# Patient Record
Sex: Female | Born: 1975 | Race: White | Hispanic: No | Marital: Married | Smoking: Never smoker
Health system: Southern US, Community
[De-identification: ages and names within clinical notes are randomized; demographics above are authoritative.]

## PROBLEM LIST (undated history)

## (undated) DIAGNOSIS — F419 Anxiety disorder, unspecified: Secondary | ICD-10-CM

## (undated) HISTORY — PX: TONSILLECTOMY: SUR1361

## (undated) HISTORY — DX: Anxiety disorder, unspecified: F41.9

---

## 1998-11-26 ENCOUNTER — Ambulatory Visit (HOSPITAL_COMMUNITY): Admission: RE | Admit: 1998-11-26 | Discharge: 1998-11-26 | Payer: Self-pay | Admitting: Obstetrics and Gynecology

## 1999-12-21 ENCOUNTER — Other Ambulatory Visit: Admission: RE | Admit: 1999-12-21 | Discharge: 1999-12-21 | Payer: Self-pay | Admitting: Obstetrics and Gynecology

## 2000-06-21 ENCOUNTER — Inpatient Hospital Stay (HOSPITAL_COMMUNITY): Admission: AD | Admit: 2000-06-21 | Discharge: 2000-06-24 | Payer: Self-pay | Admitting: Obstetrics and Gynecology

## 2000-06-21 ENCOUNTER — Encounter (INDEPENDENT_AMBULATORY_CARE_PROVIDER_SITE_OTHER): Payer: Self-pay | Admitting: Specialist

## 2000-08-08 ENCOUNTER — Other Ambulatory Visit: Admission: RE | Admit: 2000-08-08 | Discharge: 2000-08-08 | Payer: Self-pay | Admitting: Obstetrics and Gynecology

## 2001-10-19 ENCOUNTER — Other Ambulatory Visit: Admission: RE | Admit: 2001-10-19 | Discharge: 2001-10-19 | Payer: Self-pay | Admitting: Obstetrics and Gynecology

## 2002-06-14 ENCOUNTER — Encounter: Payer: Self-pay | Admitting: Family Medicine

## 2002-06-14 ENCOUNTER — Ambulatory Visit (HOSPITAL_COMMUNITY): Admission: RE | Admit: 2002-06-14 | Discharge: 2002-06-14 | Payer: Self-pay | Admitting: Family Medicine

## 2002-12-02 ENCOUNTER — Other Ambulatory Visit: Admission: RE | Admit: 2002-12-02 | Discharge: 2002-12-02 | Payer: Self-pay | Admitting: Obstetrics and Gynecology

## 2003-12-31 ENCOUNTER — Other Ambulatory Visit: Admission: RE | Admit: 2003-12-31 | Discharge: 2003-12-31 | Payer: Self-pay | Admitting: Obstetrics and Gynecology

## 2005-01-27 ENCOUNTER — Emergency Department (HOSPITAL_COMMUNITY): Admission: EM | Admit: 2005-01-27 | Discharge: 2005-01-27 | Payer: Self-pay | Admitting: Family Medicine

## 2005-02-14 ENCOUNTER — Other Ambulatory Visit: Admission: RE | Admit: 2005-02-14 | Discharge: 2005-02-14 | Payer: Self-pay | Admitting: Obstetrics and Gynecology

## 2008-10-06 ENCOUNTER — Encounter: Admission: RE | Admit: 2008-10-06 | Discharge: 2008-10-06 | Payer: Self-pay | Admitting: Family Medicine

## 2009-10-27 IMAGING — US US SOFT TISSUE HEAD/NECK
1 series · 14 of 25 positions shown · non-contrast
Comparison: None

CLINICAL DATA: Enlarged thyroid on physical exam

THYROID ULTRASOUND
TECHNIQUE: Ultrasound examination of the thyroid gland and
adjacent soft tissues was performed.

[Series 1: us soft tissue head/neck · 0.08mm/px · 14 of 37 slices shown]
[im 1/37]
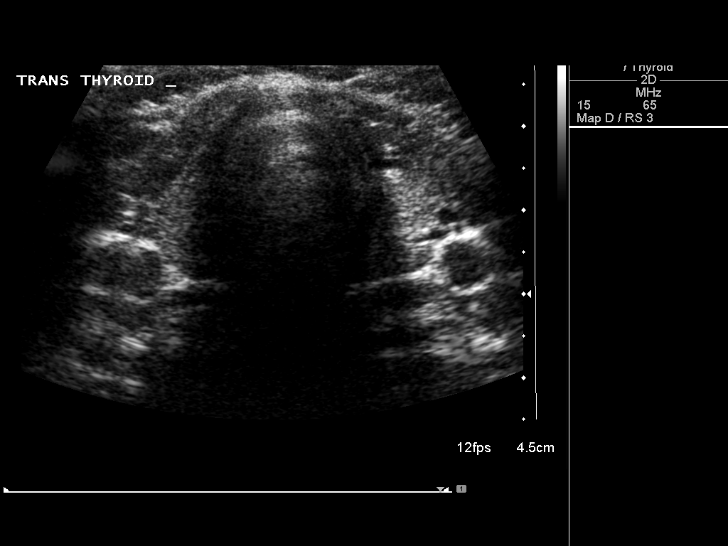
[im 4/37]
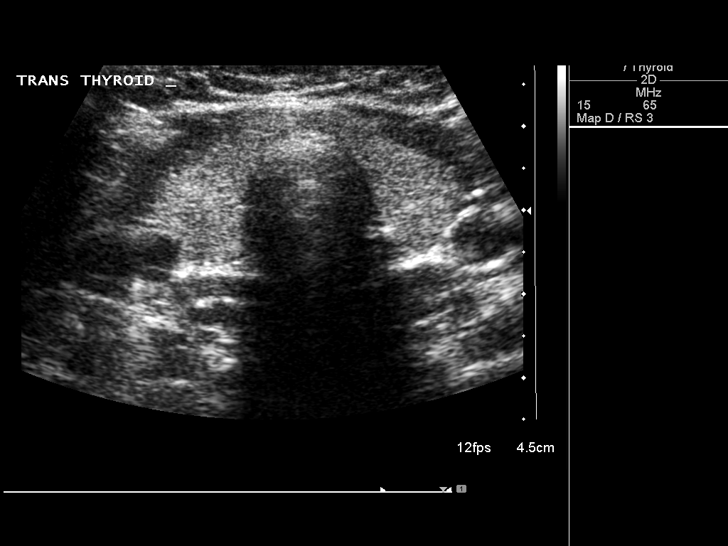
[im 7/37]
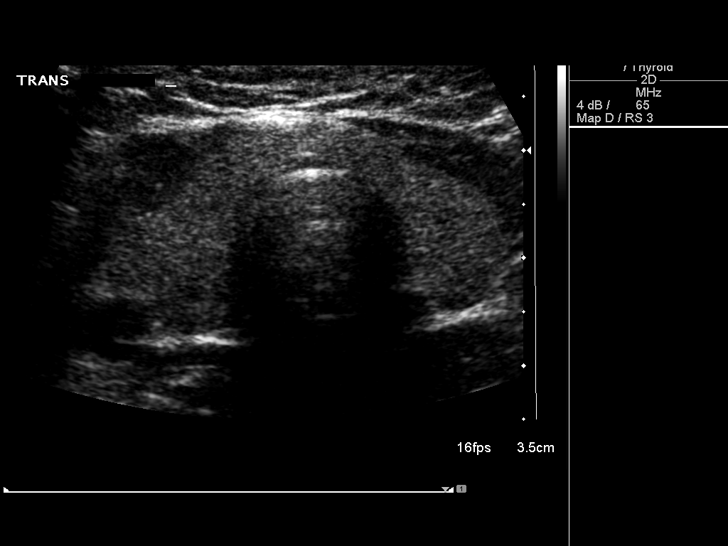
[im 10/37]
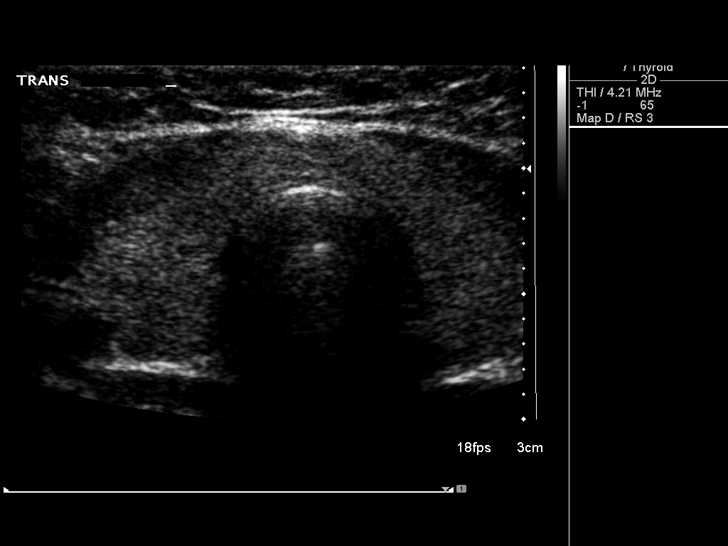
[im 13/37]
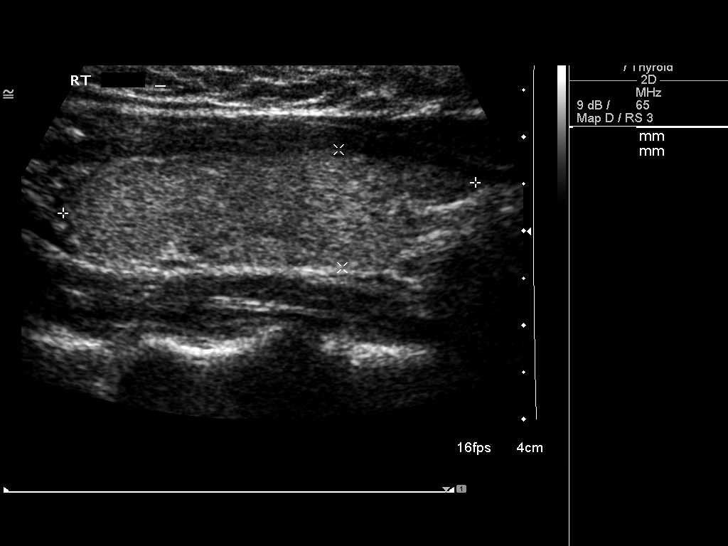
[im 14/37]
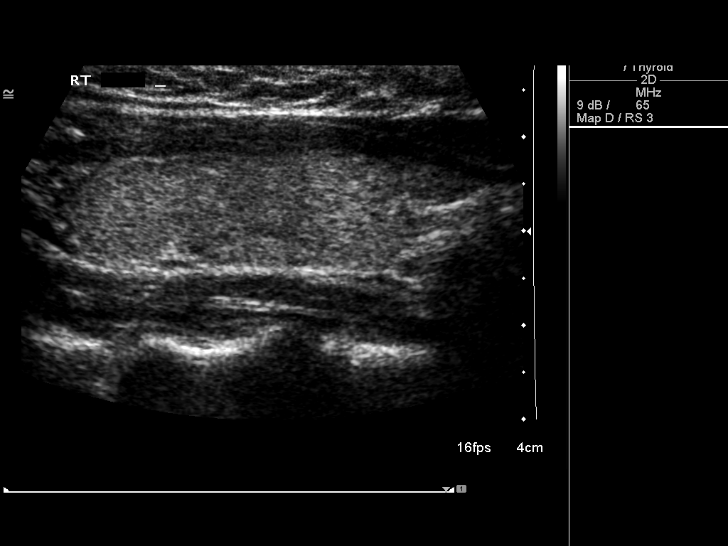
[im 17/37]
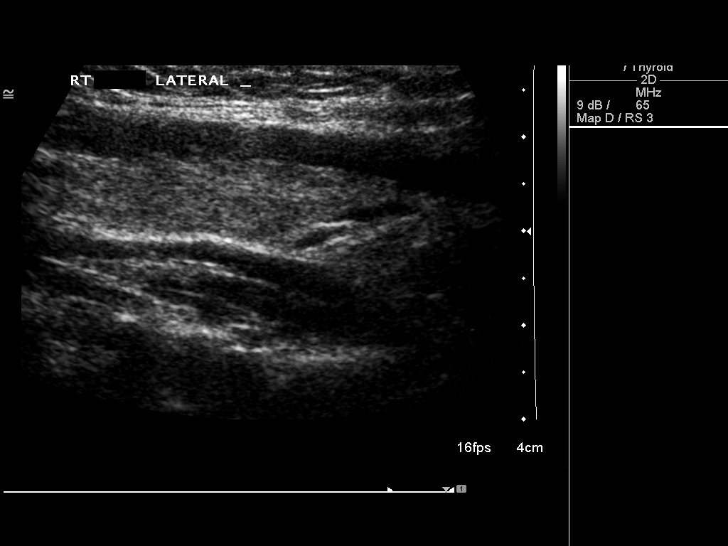
[im 20/37]
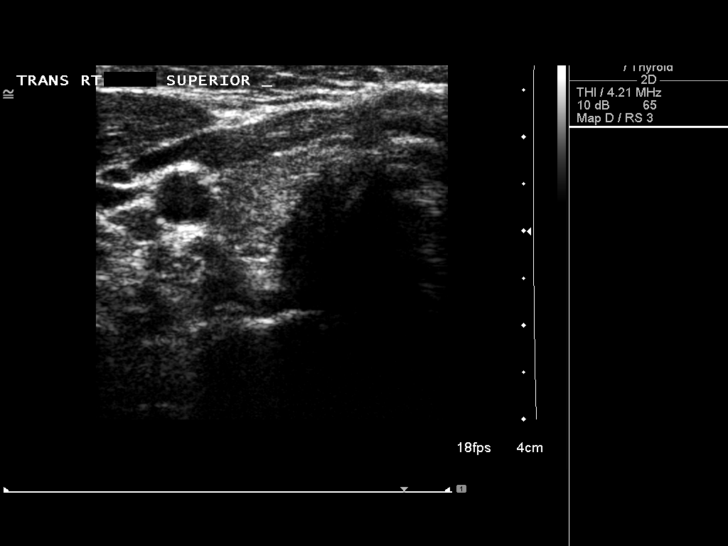
[im 23/37]
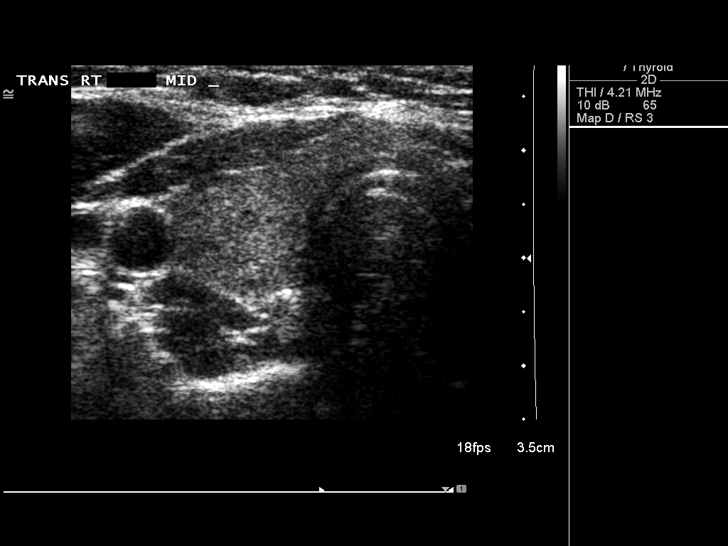
[im 25/37]
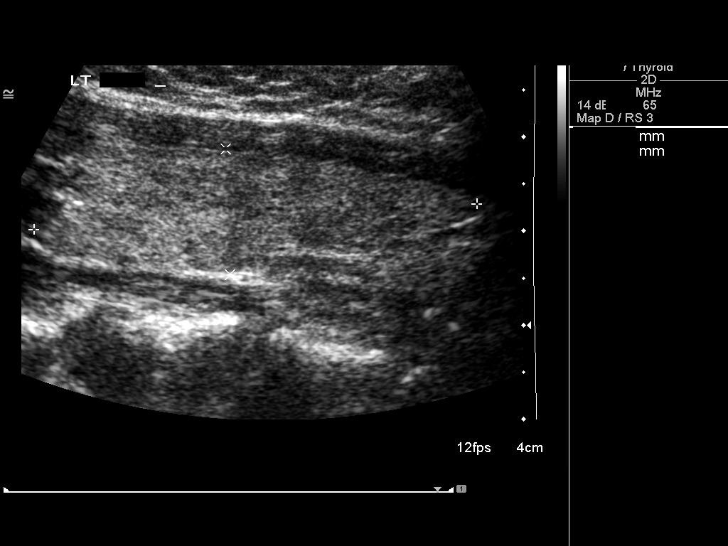
[im 28/37]
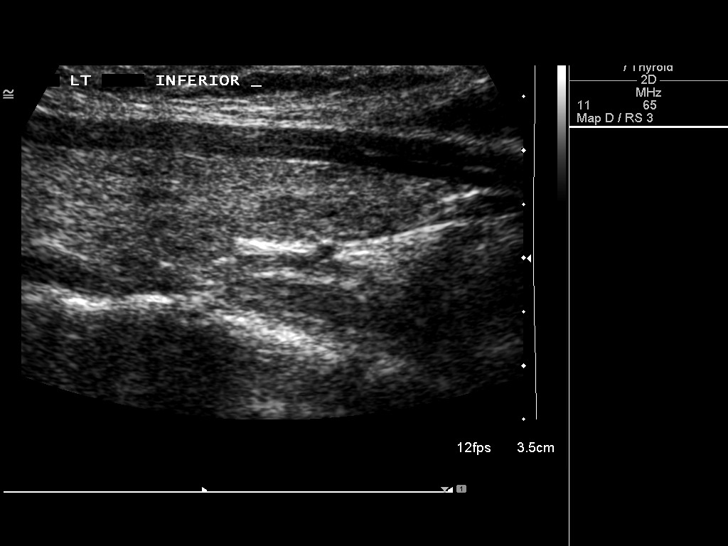
[im 31/37]
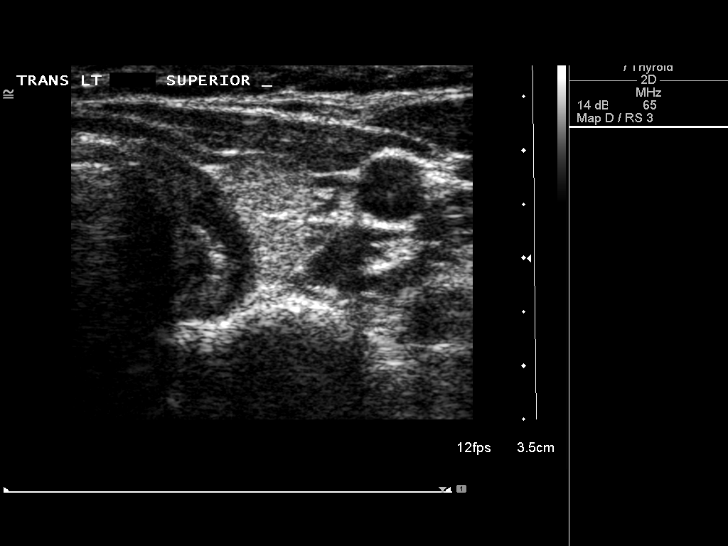
[im 34/37]
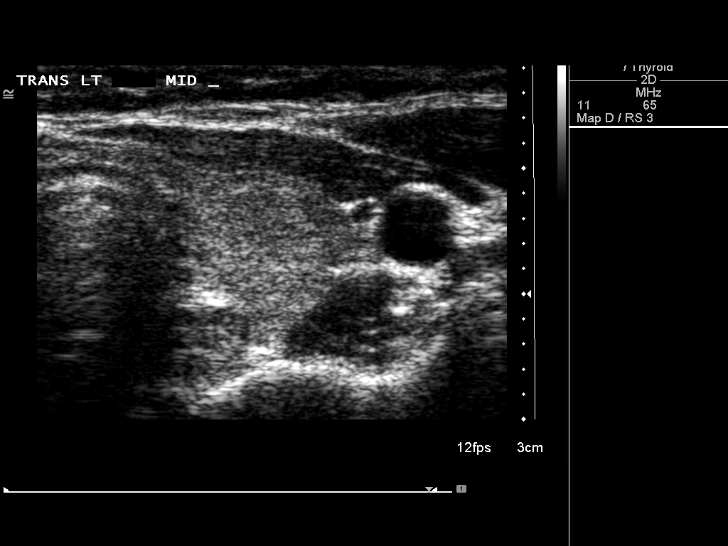
[im 37/37]
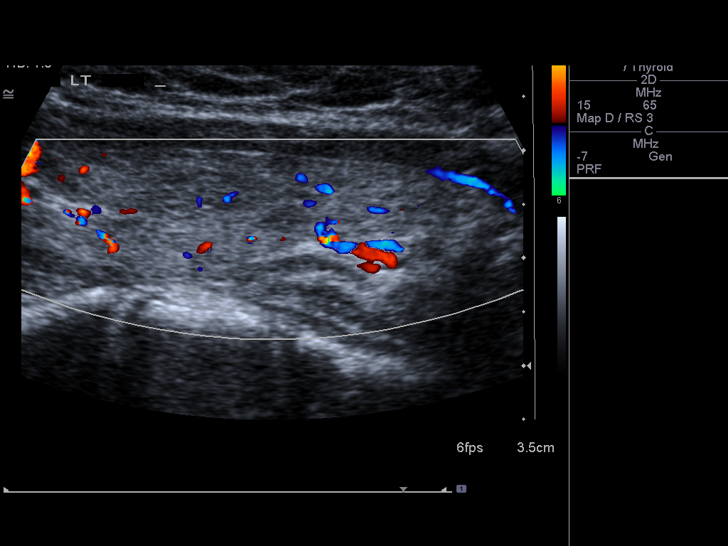

[14 of 25 positions shown; findings below may reference images not displayed]

FINDINGS: The thyroid gland is normal in size and in echogenicity.
The right lobe measures 4.4 cm sagittally, with a depth of 1.3 cm
and width of 1.3 cm.  The left lobe measures 4.7 x 1.3 x 1.4 cm,
with the isthmus measuring 3 mm.  No solid or cystic thyroid nodule
is seen.
IMPRESSION: The thyroid gland is normal in size with no solid or cystic nodule
noted.

## 2010-10-22 NOTE — Discharge Summary (Signed)
[facility]  Patient:    Danielle Wheeler, Danielle Wheeler                      MRN: [id number] Adm. Date:  [id number] Disc. Date: [id number] Attending:  Maxie Better                           Discharge Summary  ADMISSION DIAGNOSES:          Term gestation, third trimester vaginal bleeding, early labor.  DISCHARGE DIAGNOSES:          Term gestation-delivered, severe shoulder dystocia, brachial plexus injury, fourth degree perineal laceration, chorioamnionitis.  HISTORY OF PRESENT ILLNESS:   This is a 35 year old gravida 1, para 0 female at 39+ weeks gestation admitted with increased vaginal bleeding.  The patient has had her membranes stripped in the office earlier in the day.  Has done well and was subsequently brought in an hour later with a gush of vaginal bleeding.  On presentation she had a small amount of bleeding noted on her examination and the patient has had irregular contractions.  PRENATAL LABORATORIES:        Blood type O+.  Rubella immune.  Hepatitis B surface antigen is negative.  Patient had a normal anatomic fetal survey at 22 weeks and had an uncomplicated antenatal course.  HOSPITAL COURSE:              The patient was admitted.  She was placed on the monitor.  Reactive nonstress test was noted.  Irregular contractions were seen.  PHYSICAL EXAMINATION  VITAL SIGNS:                  Temperature 97.9.  PELVIC:                       Cervix was 3 cm, 100% effaced, +1 station.                                Artificial rupture of membranes was performed. Slight blood tinged fluid was noted.  The patient was group B strep culture positive and was given penicillin prophylaxis prior to rupturing the membranes.  She subsequently received an epidural and intrauterine catheter was placed.  Penicillin prophylaxis was continued as was Pitocin.  Patient subsequently became ______ and with pushing deep variable decelerations were noted on external  monitoring which resulted in an internal fetal scalp electrode being applied.  The patient had presumptive diagnosis of chorioamnionitis with a temperature spike as well as fetal tachycardia and gentamycin was added to her antibiotics regimen.  Tylenol was given for temperature.  The patient was asked to continue to push.  She was pushing for 1+ hours.  Fetal tachycardia had increased to the 190s and the patient had been given additional Tylenol to reduce her temperature which had remained persistently elevated.  Examination at that time revealed that the patient was fully dilated, +2 station, left occiput anterior presentation and the vertex could be seen with pushing with the labia separated.  Patient was offered vacuum assistance.  The risk of the procedure was explained to the patient and the husband.  They concurred with the plan.  The bladder was catheterized. The vacuum was applied with subsequent delivery after four contractions over a midline episiotomy with the second pull of a live female infant.  The episiotomy  extended to a fourth degree.  Pediatricians were present for the delivery second to vacuum as well as fetal tachycardia.  Weight of the baby was 8 pounds 12 ounces.  During the delivery severe shoulder dystocia was noted which was not responsive to suprapubic pressure and McRoberts maneuver.  The left posterior arm was delivered with subsequent delivery of right anterior shoulder.  Cord pH was obtained which was 7.22.  Apgars were 4 and 8.  The right extremity was noted to be moving.  There was no clavicle fracture on palpation.  Placenta came out spontaneously and was sent to pathology.  The fourth degree extension was also repaired ______ and there was a vaginal inspection performed.  Left vaginal sulcus laceration ______.  No cervical laceration was noted.  Intact mucosa was noted ______ and no sutures palpable.  The baby was then ______.  The patients postoperative  course was subsequently unremarkable.  She was continued on IV antibiotics for 24 hours and was subsequently afebrile throughout the course.  Her CBC on postoperative day #1 showed a hemoglobin of 11.1, hematocrit 32.4, white count 15.4.  By postpartum day #2 the patient remains afebrile with soreness in her perineal area, but otherwise had been passing flatus without incident.  She denied any fecal or flatus incontinence.  DISPOSITION:                  Home.  CONDITION ON DISCHARGE:       Stable.  FOLLOW-UP:                    In the office at [facility] at two weeks postpartum.  Discharge instructions were given with postpartum booklet in addition to no straining with bowel movements, stool softener one p.o. b.i.d., and ______. DD:  07/28/00 TD:  07/28/00 Job: [id number]

## 2012-09-24 ENCOUNTER — Encounter: Payer: Self-pay | Admitting: *Deleted

## 2012-09-24 ENCOUNTER — Other Ambulatory Visit: Payer: Self-pay | Admitting: *Deleted

## 2012-09-24 MED ORDER — VILAZODONE HCL 40 MG PO TABS: 20.0000 mg | ORAL_TABLET | Freq: Every day | ORAL | Status: DC

## 2012-09-24 NOTE — Telephone Encounter (Signed)
LAST OV 11/13. 

## 2012-10-05 ENCOUNTER — Ambulatory Visit (INDEPENDENT_AMBULATORY_CARE_PROVIDER_SITE_OTHER): Payer: BC Managed Care – PPO | Admitting: Nurse Practitioner

## 2012-10-05 ENCOUNTER — Encounter: Payer: Self-pay | Admitting: Nurse Practitioner

## 2012-10-05 ENCOUNTER — Telehealth: Payer: Self-pay | Admitting: Nurse Practitioner

## 2012-10-05 VITALS — BP 113/77 | HR 83 | Temp 97.0°F | Ht 61.0 in | Wt 188.0 lb

## 2012-10-05 DIAGNOSIS — J309 Allergic rhinitis, unspecified: Secondary | ICD-10-CM

## 2012-10-05 MED ORDER — CETIRIZINE HCL 10 MG PO TABS: 10.0000 mg | ORAL_TABLET | Freq: Every day | ORAL | Status: DC

## 2012-10-05 MED ORDER — FLUTICASONE PROPIONATE 50 MCG/ACT NA SUSP: 2.0000 | Freq: Every day | NASAL | Status: AC

## 2012-10-05 MED ORDER — CETIRIZINE HCL 10 MG PO TABS: 10.0000 mg | ORAL_TABLET | Freq: Every day | ORAL | Status: AC

## 2012-10-05 MED ORDER — FLUTICASONE PROPIONATE 50 MCG/ACT NA SUSP: 2.0000 | Freq: Every day | NASAL | Status: DC

## 2012-10-05 NOTE — Progress Notes (Signed)
  Subjective:    Patient ID: Danielle Wheeler, female    DOB: 06/09/1975, 37 y.o.   MRN: [id number]  HPI- Patient in today c/o congestion- more in her chest than sinuses. She was given antibiotic 3 weeks ago and got better then started back up after she finished the antibiotic. Cough is dry. She has tried mucinex maximum strength and claritin- no relief.    Review of Systems  Constitutional: Negative for fever and chills.  HENT: Negative for ear pain, congestion, rhinorrhea, sneezing, postnasal drip and sinus pressure.   Eyes: Negative.   Respiratory: Positive for cough and chest tightness.   Cardiovascular: Negative.        Objective:   Physical Exam  Constitutional: She appears well-developed and well-nourished.  HENT:  Right Ear: Hearing, tympanic membrane, external ear and ear canal normal.  Left Ear: Tympanic membrane, external ear and ear canal normal.  Nose: Mucosal edema and rhinorrhea present. Right sinus exhibits no maxillary sinus tenderness and no frontal sinus tenderness. Left sinus exhibits no maxillary sinus tenderness and no frontal sinus tenderness.  Mouth/Throat: Posterior oropharyngeal erythema present.  Eyes: EOM are normal. Pupils are equal, round, and reactive to light.  Neck: Normal range of motion. Neck supple.  Cardiovascular: Normal rate, normal heart sounds and intact distal pulses.   Pulmonary/Chest: Effort normal and breath sounds normal.  Lymphadenopathy:    She has no cervical adenopathy.  Skin: Skin is warm and dry.  Psychiatric: She has a normal mood and affect. Her behavior is normal. Judgment and thought content normal.   BP 113/77  Pulse 83  Temp(Src) 97 F (36.1 C) (Oral)  Ht 5\' 1"  (1.549 m)  Wt 188 lb (85.276 kg)  BMI 35.54 kg/m2        Assessment & Plan:  1. Allergic rhinitis Avoid allergens Force fluids  - fluticasone (FLONASE) 50 MCG/ACT nasal spray; Place 2 sprays into the nose daily.  Dispense: 16 g; Refill: 6 - cetirizine  (ZYRTEC) 10 MG tablet; Take 1 tablet (10 mg total) by mouth daily.  Dispense: 30 tablet; Refill: 11  Mary-Margaret Daphine Deutscher, FNP

## 2012-10-05 NOTE — Patient Instructions (Signed)
Allergic Rhinitis  Allergic rhinitis is when the mucous membranes in the nose respond to allergens. Allergens are particles in the air that cause your body to have an allergic reaction. This causes you to release allergic antibodies. Through a chain of events, these eventually cause you to release histamine into the blood stream (hence the use of antihistamines). Although meant to be protective to the body, it is this release that causes your discomfort, such as frequent sneezing, congestion and an itchy runny nose.    CAUSES    The pollen allergens may come from grasses, trees, and weeds. This is seasonal allergic rhinitis, or "hay fever." Other allergens cause year-round allergic rhinitis (perennial allergic rhinitis) such as house dust mite allergen, pet dander and mold spores.    SYMPTOMS     Nasal stuffiness (congestion).   Runny, itchy nose with sneezing and tearing of the eyes.   There is often an itching of the mouth, eyes and ears.  It cannot be cured, but it can be controlled with medications.  DIAGNOSIS    If you are unable to determine the offending allergen, skin or blood testing may find it.  TREATMENT     Avoid the allergen.   Medications and allergy shots (immunotherapy) can help.   Hay fever may often be treated with antihistamines in pill or nasal spray forms. Antihistamines block the effects of histamine. There are over-the-counter medicines that may help with nasal congestion and swelling around the eyes. Check with your caregiver before taking or giving this medicine.  If the treatment above does not work, there are many new medications your caregiver can prescribe. Stronger medications may be used if initial measures are ineffective. Desensitizing injections can be used if medications and avoidance fails. Desensitization is when a patient is given ongoing shots until the body becomes less sensitive to the allergen. Make sure you follow up with your caregiver if problems continue.   SEEK MEDICAL CARE IF:     You develop fever (more than 100.5 F (38.1 C).   You develop a cough that does not stop easily (persistent).   You have shortness of breath.   You start wheezing.   Symptoms interfere with normal daily activities.  Document Released: 02/15/2001 Document Revised: 08/15/2011 Document Reviewed: 08/27/2008  ExitCare Patient Information 2013 [employer].

## 2012-10-05 NOTE — Telephone Encounter (Signed)
appt made

## 2012-10-05 NOTE — Addendum Note (Signed)
Addended by: Bennie Pierini on: 10/05/2012 03:45 PM   Modules accepted: Orders

## 2013-04-17 ENCOUNTER — Other Ambulatory Visit: Payer: Self-pay | Admitting: Nurse Practitioner

## 2013-04-17 NOTE — Telephone Encounter (Signed)
NOT SEEN IN 6 MONTHS AND ASKING FOR 90 DAY SUPPLY

## 2013-05-15 ENCOUNTER — Other Ambulatory Visit: Payer: Self-pay | Admitting: Nurse Practitioner

## 2013-10-31 ENCOUNTER — Other Ambulatory Visit: Payer: Self-pay

## 2013-10-31 MED ORDER — VILAZODONE HCL 40 MG PO TABS: ORAL_TABLET | ORAL | Status: DC

## 2013-10-31 NOTE — Telephone Encounter (Signed)
Last seen 10/05/12  MMM

## 2014-04-09 ENCOUNTER — Encounter: Payer: Self-pay | Admitting: Nurse Practitioner

## 2014-04-09 DIAGNOSIS — F410 Panic disorder [episodic paroxysmal anxiety] without agoraphobia: Secondary | ICD-10-CM | POA: Insufficient documentation

## 2014-04-09 DIAGNOSIS — F411 Generalized anxiety disorder: Secondary | ICD-10-CM | POA: Insufficient documentation

## 2014-05-06 ENCOUNTER — Telehealth: Payer: Self-pay | Admitting: Nurse Practitioner

## 2014-05-06 ENCOUNTER — Other Ambulatory Visit: Payer: Self-pay | Admitting: Nurse Practitioner

## 2014-05-06 MED ORDER — VILAZODONE HCL 40 MG PO TABS: ORAL_TABLET | ORAL | Status: DC

## 2014-05-06 NOTE — Telephone Encounter (Signed)
Appointment given for 12/29 with Paulene FloorMary Martin, FNP

## 2014-06-03 ENCOUNTER — Encounter: Payer: Self-pay | Admitting: Nurse Practitioner

## 2014-06-03 ENCOUNTER — Ambulatory Visit (INDEPENDENT_AMBULATORY_CARE_PROVIDER_SITE_OTHER): Payer: BC Managed Care – PPO | Admitting: Nurse Practitioner

## 2014-06-03 VITALS — BP 119/75 | HR 73 | Temp 96.7°F | Ht 61.0 in | Wt 178.0 lb

## 2014-06-03 DIAGNOSIS — F410 Panic disorder [episodic paroxysmal anxiety] without agoraphobia: Secondary | ICD-10-CM

## 2014-06-03 DIAGNOSIS — F411 Generalized anxiety disorder: Secondary | ICD-10-CM

## 2014-06-03 MED ORDER — VILAZODONE HCL 40 MG PO TABS: ORAL_TABLET | ORAL | Status: DC

## 2014-06-03 NOTE — Patient Instructions (Signed)
Stress and Stress Management Stress is a normal reaction to life events. It is what you feel when life demands more than you are used to or more than you can handle. Some stress can be useful. For example, the stress reaction can help you catch the last bus of the day, study for a test, or meet a deadline at work. But stress that occurs too often or for too long can cause problems. It can affect your emotional health and interfere with relationships and normal daily activities. Too much stress can weaken your immune system and increase your risk for physical illness. If you already have a medical problem, stress can make it worse. CAUSES  All sorts of life events may cause stress. An event that causes stress for one person may not be stressful for another person. Major life events commonly cause stress. These may be positive or negative. Examples include losing your job, moving into a new home, getting married, having a baby, or losing a loved one. Less obvious life events may also cause stress, especially if they occur day after day or in combination. Examples include working long hours, driving in traffic, caring for children, being in debt, or being in a difficult relationship. SIGNS AND SYMPTOMS Stress may cause emotional symptoms including, the following:  Anxiety. This is feeling worried, afraid, on edge, overwhelmed, or out of control.  Anger. This is feeling irritated or impatient.  Depression. This is feeling sad, down, helpless, or guilty.  Difficulty focusing, remembering, or making decisions. Stress may cause physical symptoms, including the following:   Aches and pains. These may affect your head, neck, back, stomach, or other areas of your body.  Tight muscles or clenched jaw.  Low energy or trouble sleeping. Stress may cause unhealthy behaviors, including the following:   Eating to feel better (overeating) or skipping meals.  Sleeping too little, too much, or both.  Working  too much or putting off tasks (procrastination).  Smoking, drinking alcohol, or using drugs to feel better. DIAGNOSIS  Stress is diagnosed through an assessment by your health care provider. Your health care provider will ask questions about your symptoms and any stressful life events.Your health care provider will also ask about your medical history and may order blood tests or other tests. Certain medical conditions and medicine can cause physical symptoms similar to stress. Mental illness can cause emotional symptoms and unhealthy behaviors similar to stress. Your health care provider may refer you to a mental health professional for further evaluation.  TREATMENT  Stress management is the recommended treatment for stress.The goals of stress management are reducing stressful life events and coping with stress in healthy ways.  Techniques for reducing stressful life events include the following:  Stress identification. Self-monitor for stress and identify what causes stress for you. These skills may help you to avoid some stressful events.  Time management. Set your priorities, keep a calendar of events, and learn to say "no." These tools can help you avoid making too many commitments. Techniques for coping with stress include the following:  Rethinking the problem. Try to think realistically about stressful events rather than ignoring them or overreacting. Try to find the positives in a stressful situation rather than focusing on the negatives.  Exercise. Physical exercise can release both physical and emotional tension. The key is to find a form of exercise you enjoy and do it regularly.  Relaxation techniques. These relax the body and mind. Examples include yoga, meditation, tai chi, biofeedback, deep  breathing, progressive muscle relaxation, listening to music, being out in nature, journaling, and other hobbies. Again, the key is to find one or more that you enjoy and can do  regularly.  Healthy lifestyle. Eat a balanced diet, get plenty of sleep, and do not smoke. Avoid using alcohol or drugs to relax.  Strong support network. Spend time with family, friends, or other people you enjoy being around.Express your feelings and talk things over with someone you trust. Counseling or talktherapy with a mental health professional may be helpful if you are having difficulty managing stress on your own. Medicine is typically not recommended for the treatment of stress.Talk to your health care provider if you think you need medicine for symptoms of stress. HOME CARE INSTRUCTIONS  Keep all follow-up visits as directed by your health care provider.  Take all medicines as directed by your health care provider. SEEK MEDICAL CARE IF:  Your symptoms get worse or you start having new symptoms.  You feel overwhelmed by your problems and can no longer manage them on your own. SEEK IMMEDIATE MEDICAL CARE IF:  You feel like hurting yourself or someone else. Document Released: 11/16/2000 Document Revised: 10/07/2013 Document Reviewed: 01/15/2013 ExitCare Patient Information 2015 [employer]. This information is not intended to replace advice given to you by your health care provider. Make sure you discuss any questions you have with your health care provider.  

## 2014-06-03 NOTE — Progress Notes (Signed)
   Subjective:    Patient ID: Danielle McmurrayWindy W Waldron, female    DOB: April 16, 1976, 38 y.o.   MRN: [id number]  HPI Patient here today for follow up- - GAD and panic attacks- She is currently on Viibryd 40mg  daily. Patient says taht it is working great. No side effects.    Review of Systems  Constitutional: Negative.   HENT: Negative.   Respiratory: Negative.   Cardiovascular: Negative.   Genitourinary: Negative.   Neurological: Negative.   Psychiatric/Behavioral: Negative.   All other systems reviewed and are negative.      Objective:   Physical Exam  Constitutional: She is oriented to person, place, and time. She appears well-developed and well-nourished.  Cardiovascular: Normal rate and normal heart sounds.   Pulmonary/Chest: Effort normal.  Abdominal: Soft. Bowel sounds are normal.  Neurological: She is alert and oriented to person, place, and time.  Skin: Skin is warm.  Psychiatric: She has a normal mood and affect. Her behavior is normal. Judgment and thought content normal.   BP 119/75 mmHg  Pulse 73  Temp(Src) 96.7 F (35.9 C) (Oral)  Ht 5\' 1"  (1.549 m)  Wt 178 lb (80.74 kg)  BMI 33.65 kg/m2        Assessment & Plan:   1. GAD (generalized anxiety disorder)   2. Panic attacks    Meds ordered this encounter  Medications  . Vilazodone HCl (VIIBRYD) 40 MG TABS    Sig: TAKE 0.5 TABLETS (20 MG TOTAL) BY MOUTH DAILY.    Dispense:  30 tablet    Refill:  5    Order Specific Question:  Supervising Provider    Answer:  Deborra MedinaMOORE, DONALD W [[id number]]   Stress management RTO prn  Mary-Margaret Daphine DeutscherMartin, FNP

## 2014-09-15 ENCOUNTER — Other Ambulatory Visit: Payer: Self-pay | Admitting: *Deleted

## 2014-09-15 MED ORDER — VILAZODONE HCL 40 MG PO TABS: ORAL_TABLET | ORAL | Status: DC

## 2015-12-02 ENCOUNTER — Other Ambulatory Visit: Payer: Self-pay | Admitting: Nurse Practitioner

## 2015-12-14 ENCOUNTER — Telehealth: Payer: Self-pay | Admitting: Nurse Practitioner

## 2015-12-14 MED ORDER — VILAZODONE HCL 40 MG PO TABS: ORAL_TABLET | ORAL | Status: DC

## 2015-12-14 NOTE — Telephone Encounter (Signed)
viibryd refilled

## 2015-12-14 NOTE — Telephone Encounter (Signed)
Please review and advise.

## 2016-01-04 ENCOUNTER — Ambulatory Visit: Payer: Self-pay | Admitting: Nurse Practitioner

## 2016-01-05 ENCOUNTER — Encounter: Payer: Self-pay | Admitting: Nurse Practitioner

## 2016-01-25 ENCOUNTER — Telehealth: Payer: Self-pay | Admitting: Nurse Practitioner

## 2016-01-25 NOTE — Telephone Encounter (Signed)
Pt given appt with MMM 02/19/16 at 8:15.

## 2016-01-28 ENCOUNTER — Ambulatory Visit: Payer: Self-pay | Admitting: Nurse Practitioner

## 2016-02-19 ENCOUNTER — Ambulatory Visit (INDEPENDENT_AMBULATORY_CARE_PROVIDER_SITE_OTHER): Payer: BLUE CROSS/BLUE SHIELD | Admitting: Nurse Practitioner

## 2016-02-19 ENCOUNTER — Encounter: Payer: Self-pay | Admitting: Nurse Practitioner

## 2016-02-19 VITALS — BP 131/84 | HR 65 | Temp 97.3°F | Ht 61.0 in | Wt 172.0 lb

## 2016-02-19 DIAGNOSIS — F410 Panic disorder [episodic paroxysmal anxiety] without agoraphobia: Secondary | ICD-10-CM

## 2016-02-19 DIAGNOSIS — F411 Generalized anxiety disorder: Secondary | ICD-10-CM

## 2016-02-19 NOTE — Progress Notes (Signed)
   Subjective:    Patient ID: Danielle Wheeler, female    DOB: 04-21-1976, 40 y.o.   MRN: [id number]  HPI  Patient here today for follow up- - GAD and panic attacks- She is currently on Viibryd 20mg  daily. Patient says that it is working great. No side effects.    Review of Systems  Constitutional: Negative.   HENT: Negative.   Respiratory: Negative.   Cardiovascular: Negative.   Genitourinary: Negative.   Neurological: Negative.   Psychiatric/Behavioral: Negative.   All other systems reviewed and are negative.      Objective:   Physical Exam  Constitutional: She is oriented to person, place, and time. She appears well-developed and well-nourished.  Cardiovascular: Normal rate and normal heart sounds.   Pulmonary/Chest: Effort normal.  Abdominal: Soft. Bowel sounds are normal.  Neurological: She is alert and oriented to person, place, and time.  Skin: Skin is warm.  Psychiatric: She has a normal mood and affect. Her behavior is normal. Judgment and thought content normal.   BP 131/84   Pulse 65   Temp 97.3 F (36.3 C) (Oral)   Ht 5\' 1"  (1.549 m)   Wt 172 lb (78 kg)   BMI 32.50 kg/m          Assessment & Plan:   1. GAD (generalized anxiety disorder)   2. Panic attacks    Continue viibryd as rx Stress management RTO in 6 months Patient will have pap and physical with GYN iin December  Bennie PieriniMary-Margaret Isadore Palecek, FNP

## 2016-02-19 NOTE — Patient Instructions (Signed)
Stress and Stress Management Stress is a normal reaction to life events. It is what you feel when life demands more than you are used to or more than you can handle. Some stress can be useful. For example, the stress reaction can help you catch the last bus of the day, study for a test, or meet a deadline at work. But stress that occurs too often or for too long can cause problems. It can affect your emotional health and interfere with relationships and normal daily activities. Too much stress can weaken your immune system and increase your risk for physical illness. If you already have a medical problem, stress can make it worse. CAUSES  All sorts of life events may cause stress. An event that causes stress for one person may not be stressful for another person. Major life events commonly cause stress. These may be positive or negative. Examples include losing your job, moving into a new home, getting married, having a baby, or losing a loved one. Less obvious life events may also cause stress, especially if they occur day after day or in combination. Examples include working long hours, driving in traffic, caring for children, being in debt, or being in a difficult relationship. SIGNS AND SYMPTOMS Stress may cause emotional symptoms including, the following:  Anxiety. This is feeling worried, afraid, on edge, overwhelmed, or out of control.  Anger. This is feeling irritated or impatient.  Depression. This is feeling sad, down, helpless, or guilty.  Difficulty focusing, remembering, or making decisions. Stress may cause physical symptoms, including the following:   Aches and pains. These may affect your head, neck, back, stomach, or other areas of your body.  Tight muscles or clenched jaw.  Low energy or trouble sleeping. Stress may cause unhealthy behaviors, including the following:   Eating to feel better (overeating) or skipping meals.  Sleeping too little, too much, or both.  Working  too much or putting off tasks (procrastination).  Smoking, drinking alcohol, or using drugs to feel better. DIAGNOSIS  Stress is diagnosed through an assessment by your health care provider. Your health care provider will ask questions about your symptoms and any stressful life events.Your health care provider will also ask about your medical history and may order blood tests or other tests. Certain medical conditions and medicine can cause physical symptoms similar to stress. Mental illness can cause emotional symptoms and unhealthy behaviors similar to stress. Your health care provider may refer you to a mental health professional for further evaluation.  TREATMENT  Stress management is the recommended treatment for stress.The goals of stress management are reducing stressful life events and coping with stress in healthy ways.  Techniques for reducing stressful life events include the following:  Stress identification. Self-monitor for stress and identify what causes stress for you. These skills may help you to avoid some stressful events.  Time management. Set your priorities, keep a calendar of events, and learn to say "no." These tools can help you avoid making too many commitments. Techniques for coping with stress include the following:  Rethinking the problem. Try to think realistically about stressful events rather than ignoring them or overreacting. Try to find the positives in a stressful situation rather than focusing on the negatives.  Exercise. Physical exercise can release both physical and emotional tension. The key is to find a form of exercise you enjoy and do it regularly.  Relaxation techniques. These relax the body and mind. Examples include yoga, meditation, tai chi, biofeedback, deep  breathing, progressive muscle relaxation, listening to music, being out in nature, journaling, and other hobbies. Again, the key is to find one or more that you enjoy and can do  regularly.  Healthy lifestyle. Eat a balanced diet, get plenty of sleep, and do not smoke. Avoid using alcohol or drugs to relax.  Strong support network. Spend time with family, friends, or other people you enjoy being around.Express your feelings and talk things over with someone you trust. Counseling or talktherapy with a mental health professional may be helpful if you are having difficulty managing stress on your own. Medicine is typically not recommended for the treatment of stress.Talk to your health care provider if you think you need medicine for symptoms of stress. HOME CARE INSTRUCTIONS  Keep all follow-up visits as directed by your health care provider.  Take all medicines as directed by your health care provider. SEEK MEDICAL CARE IF:  Your symptoms get worse or you start having new symptoms.  You feel overwhelmed by your problems and can no longer manage them on your own. SEEK IMMEDIATE MEDICAL CARE IF:  You feel like hurting yourself or someone else.   This information is not intended to replace advice given to you by your health care provider. Make sure you discuss any questions you have with your health care provider.   Document Released: 11/16/2000 Document Revised: 06/13/2014 Document Reviewed: 01/15/2013 Elsevier Interactive Patient Education 2016 Elsevier Inc.  

## 2016-05-11 DIAGNOSIS — Z6832 Body mass index (BMI) 32.0-32.9, adult: Secondary | ICD-10-CM | POA: Diagnosis not present

## 2016-05-11 DIAGNOSIS — Z1231 Encounter for screening mammogram for malignant neoplasm of breast: Secondary | ICD-10-CM | POA: Diagnosis not present

## 2016-05-11 DIAGNOSIS — Z01419 Encounter for gynecological examination (general) (routine) without abnormal findings: Secondary | ICD-10-CM | POA: Diagnosis not present

## 2017-01-10 ENCOUNTER — Other Ambulatory Visit: Payer: Self-pay | Admitting: Nurse Practitioner

## 2017-03-01 DIAGNOSIS — L218 Other seborrheic dermatitis: Secondary | ICD-10-CM | POA: Diagnosis not present

## 2017-06-19 DIAGNOSIS — Z6832 Body mass index (BMI) 32.0-32.9, adult: Secondary | ICD-10-CM | POA: Diagnosis not present

## 2017-06-19 DIAGNOSIS — Z01419 Encounter for gynecological examination (general) (routine) without abnormal findings: Secondary | ICD-10-CM | POA: Diagnosis not present

## 2017-06-19 DIAGNOSIS — Z1231 Encounter for screening mammogram for malignant neoplasm of breast: Secondary | ICD-10-CM | POA: Diagnosis not present

## 2017-07-07 DIAGNOSIS — Z30433 Encounter for removal and reinsertion of intrauterine contraceptive device: Secondary | ICD-10-CM | POA: Diagnosis not present

## 2017-08-14 DIAGNOSIS — N939 Abnormal uterine and vaginal bleeding, unspecified: Secondary | ICD-10-CM | POA: Diagnosis not present

## 2018-01-12 ENCOUNTER — Other Ambulatory Visit: Payer: Self-pay | Admitting: Nurse Practitioner

## 2018-01-12 NOTE — Telephone Encounter (Signed)
Patient aware she will need to be seen for refills on viibryd

## 2018-01-12 NOTE — Telephone Encounter (Signed)
Has not been seen in 2 years  Can't fill  Needs an appt.

## 2018-01-12 NOTE — Telephone Encounter (Signed)
[email or web address]

## 2018-01-30 ENCOUNTER — Encounter: Payer: Self-pay | Admitting: Nurse Practitioner

## 2018-01-30 ENCOUNTER — Ambulatory Visit: Payer: BLUE CROSS/BLUE SHIELD | Admitting: Nurse Practitioner

## 2018-01-30 VITALS — BP 126/86 | HR 66 | Temp 97.3°F | Ht 61.0 in | Wt 170.0 lb

## 2018-01-30 DIAGNOSIS — F411 Generalized anxiety disorder: Secondary | ICD-10-CM | POA: Diagnosis not present

## 2018-01-30 DIAGNOSIS — F410 Panic disorder [episodic paroxysmal anxiety] without agoraphobia: Secondary | ICD-10-CM | POA: Diagnosis not present

## 2018-01-30 MED ORDER — VILAZODONE HCL 40 MG PO TABS: ORAL_TABLET | ORAL | 1 refills | Status: DC

## 2018-01-30 NOTE — Progress Notes (Signed)
   Subjective:    Patient ID: Danielle Wheeler, female    DOB: 02/07/1976, 42 y.o.   MRN: [id number]   Chief Complaint: Medical Management of Chronic Issues   HPI:  1. Panic attacks  patient is on viibryd. Says she is doing well. No side effects  2. GAD (generalized anxiety disorder)  viibyrd helps with anxiety    Outpatient Encounter Medications as of 01/30/2018  Medication Sig  . VIIBRYD 40 MG TABS TAKE 1/2 TABLETS (20 MG TOTAL) BY MOUTH DAILY.  . cetirizine (ZYRTEC) 10 MG tablet Take 1 tablet (10 mg total) by mouth daily. (Patient not taking: Reported on 02/19/2016)  . fluticasone (FLONASE) 50 MCG/ACT nasal spray Place 2 sprays into the nose daily. (Patient not taking: Reported on 02/19/2016)       New complaints: None today  Social history: [place] at [employer] [place]   Review of Systems  Constitutional: Negative.   Respiratory: Negative.   Cardiovascular: Negative.   Neurological: Negative.   Psychiatric/Behavioral: Negative.   All other systems reviewed and are negative.      Objective:   Physical Exam  Constitutional: She is oriented to person, place, and time. She appears well-developed and well-nourished.  Cardiovascular: Normal rate.  Pulmonary/Chest: Effort normal.  Neurological: She is alert and oriented to person, place, and time.  Skin: Skin is warm.  Psychiatric: She has a normal mood and affect. Her behavior is normal. Judgment and thought content normal.    BP 126/86   Pulse 66   Temp (!) 97.3 F (36.3 C) (Oral)   Ht 5\' 1"  (1.549 m)   Wt 170 lb (77.1 kg)   BMI 32.12 kg/m        Assessment & Plan:  Danielle Wheeler in today with chief complaint of Medical Management of Chronic Issues   1. Panic attacks - Vilazodone HCl (VIIBRYD) 40 MG TABS; TAKE 1/2 TABLETS (20 MG TOTAL) BY MOUTH DAILY.  Dispense: 90 tablet; Refill: 1  2. GAD (generalized anxiety disorder) - Vilazodone HCl (VIIBRYD) 40 MG TABS; TAKE 1/2 TABLETS (20 MG TOTAL) BY MOUTH  DAILY.  Dispense: 90 tablet; Refill: 1  Stress management Follow up in 6 months  Mary-Margaret Daphine DeutscherMartin, FNP

## 2018-01-30 NOTE — Patient Instructions (Signed)
Stress and Stress Management Stress is a normal reaction to life events. It is what you feel when life demands more than you are used to or more than you can handle. Some stress can be useful. For example, the stress reaction can help you catch the last bus of the day, study for a test, or meet a deadline at work. But stress that occurs too often or for too long can cause problems. It can affect your emotional health and interfere with relationships and normal daily activities. Too much stress can weaken your immune system and increase your risk for physical illness. If you already have a medical problem, stress can make it worse. What are the causes? All sorts of life events may cause stress. An event that causes stress for one person may not be stressful for another person. Major life events commonly cause stress. These may be positive or negative. Examples include losing your job, moving into a new home, getting married, having a baby, or losing a loved one. Less obvious life events may also cause stress, especially if they occur day after day or in combination. Examples include working long hours, driving in traffic, caring for children, being in debt, or being in a difficult relationship. What are the signs or symptoms? Stress may cause emotional symptoms including, the following:  Anxiety. This is feeling worried, afraid, on edge, overwhelmed, or out of control.  Anger. This is feeling irritated or impatient.  Depression. This is feeling sad, down, helpless, or guilty.  Difficulty focusing, remembering, or making decisions.  Stress may cause physical symptoms, including the following:  Aches and pains. These may affect your head, neck, back, stomach, or other areas of your body.  Tight muscles or clenched jaw.  Low energy or trouble sleeping.  Stress may cause unhealthy behaviors, including the following:  Eating to feel better (overeating) or skipping meals.  Sleeping too little,  too much, or both.  Working too much or putting off tasks (procrastination).  Smoking, drinking alcohol, or using drugs to feel better.  How is this diagnosed? Stress is diagnosed through an assessment by your health care provider. Your health care provider will ask questions about your symptoms and any stressful life events.Your health care provider will also ask about your medical history and may order blood tests or other tests. Certain medical conditions and medicine can cause physical symptoms similar to stress. Mental illness can cause emotional symptoms and unhealthy behaviors similar to stress. Your health care provider may refer you to a mental health professional for further evaluation. How is this treated? Stress management is the recommended treatment for stress.The goals of stress management are reducing stressful life events and coping with stress in healthy ways. Techniques for reducing stressful life events include the following:  Stress identification. Self-monitor for stress and identify what causes stress for you. These skills may help you to avoid some stressful events.  Time management. Set your priorities, keep a calendar of events, and learn to say "no." These tools can help you avoid making too many commitments.  Techniques for coping with stress include the following:  Rethinking the problem. Try to think realistically about stressful events rather than ignoring them or overreacting. Try to find the positives in a stressful situation rather than focusing on the negatives.  Exercise. Physical exercise can release both physical and emotional tension. The key is to find a form of exercise you enjoy and do it regularly.  Relaxation techniques. These relax the body and  mind. Examples include yoga, meditation, tai chi, biofeedback, deep breathing, progressive muscle relaxation, listening to music, being out in nature, journaling, and other hobbies. Again, the key is to find  one or more that you enjoy and can do regularly.  Healthy lifestyle. Eat a balanced diet, get plenty of sleep, and do not smoke. Avoid using alcohol or drugs to relax.  Strong support network. Spend time with family, friends, or other people you enjoy being around.Express your feelings and talk things over with someone you trust.  Counseling or talktherapy with a mental health professional may be helpful if you are having difficulty managing stress on your own. Medicine is typically not recommended for the treatment of stress.Talk to your health care provider if you think you need medicine for symptoms of stress. Follow these instructions at home:  Keep all follow-up visits as directed by your health care provider.  Take all medicines as directed by your health care provider. Contact a health care provider if:  Your symptoms get worse or you start having new symptoms.  You feel overwhelmed by your problems and can no longer manage them on your own. Get help right away if:  You feel like hurting yourself or someone else. This information is not intended to replace advice given to you by your health care provider. Make sure you discuss any questions you have with your health care provider. Document Released: 11/16/2000 Document Revised: 10/29/2015 Document Reviewed: 01/15/2013 Elsevier Interactive Patient Education  2017 Elsevier Inc.  

## 2018-02-15 DIAGNOSIS — L738 Other specified follicular disorders: Secondary | ICD-10-CM | POA: Diagnosis not present

## 2018-03-26 DIAGNOSIS — L738 Other specified follicular disorders: Secondary | ICD-10-CM | POA: Diagnosis not present

## 2018-07-02 ENCOUNTER — Ambulatory Visit: Payer: BLUE CROSS/BLUE SHIELD | Admitting: Nurse Practitioner

## 2018-07-02 ENCOUNTER — Encounter: Payer: Self-pay | Admitting: Nurse Practitioner

## 2018-07-02 VITALS — BP 131/88 | HR 79 | Temp 97.8°F | Ht 61.0 in | Wt 170.0 lb

## 2018-07-02 DIAGNOSIS — F410 Panic disorder [episodic paroxysmal anxiety] without agoraphobia: Secondary | ICD-10-CM | POA: Diagnosis not present

## 2018-07-02 DIAGNOSIS — R002 Palpitations: Secondary | ICD-10-CM

## 2018-07-02 DIAGNOSIS — F411 Generalized anxiety disorder: Secondary | ICD-10-CM | POA: Diagnosis not present

## 2018-07-02 MED ORDER — VILAZODONE HCL 40 MG PO TABS: ORAL_TABLET | ORAL | 1 refills | Status: DC

## 2018-07-02 NOTE — Progress Notes (Signed)
   Subjective:    Patient ID: Danielle Wheeler, female    DOB: September 19, 1975, 43 y.o.   MRN: [id number]   Chief Complaint: Medical Management of Chronic Issues (Heart palpitation)   HPI:  1. Panic attacks   2. GAD (generalized anxiety disorder)    * patient has a long history of depression and anxiety. She has been on viibryd 40 mg for awhile now. Says she is doing well. Depression screen [facility] 2/9 07/02/2018 01/30/2018 02/19/2016  Decreased Interest 0 0 0  Down, Depressed, Hopeless 0 0 0  PHQ - 2 Score 0 0 0     Outpatient Encounter Medications as of 07/02/2018  Medication Sig  . Vilazodone HCl (VIIBRYD) 40 MG TABS TAKE 1/2 TABLETS (20 MG TOTAL) BY MOUTH DAILY.  . cetirizine (ZYRTEC) 10 MG tablet Take 1 tablet (10 mg total) by mouth daily. (Patient not taking: Reported on 02/19/2016)  . fluticasone (FLONASE) 50 MCG/ACT nasal spray Place 2 sprays into the nose daily. (Patient not taking: Reported on 02/19/2016)      New complaints: She is c/o heart palpitations. Feels like a little flutter in her chest. Happens couple times of day. Only lasts a few seconds.  Social history:    Review of Systems  Constitutional: Negative.   HENT: Negative.   Respiratory: Negative.  Negative for shortness of breath.   Cardiovascular: Positive for palpitations. Negative for chest pain.  Gastrointestinal: Negative.   Genitourinary: Negative.   Skin: Negative.   Neurological: Negative.   Psychiatric/Behavioral: Negative.   All other systems reviewed and are negative.      Objective:   Physical Exam Constitutional:      General: She is not in acute distress.    Appearance: She is obese.  Neck:     Musculoskeletal: Normal range of motion.  Cardiovascular:     Rate and Rhythm: Normal rate and regular rhythm.     Pulses: Normal pulses.     Heart sounds: Normal heart sounds.  Pulmonary:     Effort: Pulmonary effort is normal.     Breath sounds: Normal breath sounds.  Abdominal:     General:  Abdomen is flat.     Palpations: Abdomen is soft.  Neurological:     General: No focal deficit present.     Mental Status: She is alert and oriented to person, place, and time.  Psychiatric:        Mood and Affect: Mood normal.        Behavior: Behavior normal.     BP 131/88   Pulse 79   Temp 97.8 F (36.6 C) (Oral)   Ht 5\' 1"  (1.549 m)   Wt 170 lb (77.1 kg)   BMI 32.12 kg/m   EKG- Brendolyn Patty, FNP      Assessment & Plan:  Danielle Wheeler in today with chief complaint of Medical Management of Chronic Issues (Heart palpitation)   1. Panic attacks - Vilazodone HCl (VIIBRYD) 40 MG TABS; TAKE 1/2 TABLETS (20 MG TOTAL) BY MOUTH DAILY.  Dispense: 90 tablet; Refill: 1  2. GAD (generalized anxiety disorder) Stress management - Vilazodone HCl (VIIBRYD) 40 MG TABS; TAKE 1/2 TABLETS (20 MG TOTAL) BY MOUTH DAILY.  Dispense: 90 tablet; Refill: 1  3. Heart palpitations Keep diary of episodes- if occurring more frequently needs to wear monitor. - EKG 12-Lead   Mary-Margaret Daphine Deutscher, FNP

## 2018-07-02 NOTE — Patient Instructions (Signed)
Palpitations  Palpitations are feelings that your heartbeat is not normal. Your heartbeat may feel like it is:   Uneven.   Faster than normal.   Fluttering.   Skipping a beat.  This is usually not a serious problem. In some cases, you may need tests to rule out any serious problems.  Follow these instructions at home:  Pay attention to any changes in your condition. Take these actions to help manage your symptoms:  Eating and drinking   Avoid:  ? Coffee, tea, soft drinks, and energy drinks.  ? Chocolate.  ? Alcohol.  ? Diet pills.  Lifestyle     Try to lower your stress. These things can help you relax:  ? Yoga.  ? Deep breathing and meditation.  ? Exercise.  ? Using words and images to create positive thoughts (guided imagery).  ? Using your mind to control things in your body (biofeedback).   Do not use drugs.   Get plenty of rest and sleep. Keep a regular bed time.  General instructions     Take over-the-counter and prescription medicines only as told by your doctor.   Do not use any products that contain nicotine or tobacco, such as cigarettes and e-cigarettes. If you need help quitting, ask your doctor.   Keep all follow-up visits as told by your doctor. This is important. You may need more tests if palpitations do not go away or get worse.  Contact a doctor if:   Your symptoms last more than 24 hours.   Your symptoms occur more often.  Get help right away if you:   Have chest pain.   Feel short of breath.   Have a very bad headache.   Feel dizzy.   Pass out (faint).  Summary   Palpitations are feelings that your heartbeat is uneven or faster than normal. It may feel like your heart is fluttering or skipping a beat.   Avoid food and drinks that may cause palpitations. These include caffeine, chocolate, and alcohol.   Try to lower your stress. Do not smoke or use drugs.   Get help right away if you faint or have chest pain, shortness of breath, a severe headache, or dizziness.  This  information is not intended to replace advice given to you by your health care provider. Make sure you discuss any questions you have with your health care provider.  Document Released: 03/01/2008 Document Revised: 07/05/2017 Document Reviewed: 07/05/2017  Elsevier Interactive Patient Education  2019 Elsevier Inc.

## 2018-07-16 ENCOUNTER — Encounter: Payer: Self-pay | Admitting: Nurse Practitioner

## 2018-07-16 ENCOUNTER — Ambulatory Visit (INDEPENDENT_AMBULATORY_CARE_PROVIDER_SITE_OTHER): Payer: BLUE CROSS/BLUE SHIELD | Admitting: Nurse Practitioner

## 2018-07-16 VITALS — BP 122/81 | HR 75 | Temp 97.6°F | Ht 61.0 in | Wt 168.0 lb

## 2018-07-16 DIAGNOSIS — F411 Generalized anxiety disorder: Secondary | ICD-10-CM

## 2018-07-16 DIAGNOSIS — Z0000 Encounter for general adult medical examination without abnormal findings: Secondary | ICD-10-CM

## 2018-07-16 DIAGNOSIS — F410 Panic disorder [episodic paroxysmal anxiety] without agoraphobia: Secondary | ICD-10-CM | POA: Diagnosis not present

## 2018-07-16 DIAGNOSIS — Z0001 Encounter for general adult medical examination with abnormal findings: Secondary | ICD-10-CM | POA: Diagnosis not present

## 2018-07-16 MED ORDER — VILAZODONE HCL 40 MG PO TABS: ORAL_TABLET | ORAL | 1 refills | Status: DC

## 2018-07-16 NOTE — Progress Notes (Signed)
Subjective:    Patient ID: Danielle Wheeler, female    DOB: 1975-11-05, 43 y.o.   MRN: [id number]   Chief Complaint: Annual Exam (No pap)   HPI:  1. Annual physical exam  No pap. She had pap done 06/19/17 at GYN office and was normal.  2. Panic attacks  she is currently on viibryd 2m daily- has had no recent panic attacks.  3. GAD (generalized anxiety disorder)  Again she is on viibryd and that keeps her calm and from worrying all the time.    Outpatient Encounter Medications as of 07/16/2018  Medication Sig  . Vilazodone HCl (VIIBRYD) 40 MG TABS TAKE 1/2 TABLETS (20 MG TOTAL) BY MOUTH DAILY.  . cetirizine (ZYRTEC) 10 MG tablet Take 1 tablet (10 mg total) by mouth daily. (Patient not taking: Reported on 02/19/2016)  . fluticasone (FLONASE) 50 MCG/ACT nasal spray Place 2 sprays into the nose daily. (Patient not taking: Reported on 02/19/2016)       New complaints: None today   Social history: works at [employer].   Review of Systems  Constitutional: Negative for activity change and appetite change.  HENT: Negative.   Eyes: Negative for pain.  Respiratory: Negative for shortness of breath.   Cardiovascular: Negative for chest pain, palpitations and leg swelling.  Gastrointestinal: Negative for abdominal pain.  Endocrine: Negative for polydipsia.  Genitourinary: Negative.   Skin: Negative for rash.  Neurological: Negative for dizziness, weakness and headaches.  Hematological: Does not bruise/bleed easily.  Psychiatric/Behavioral: Negative.   All other systems reviewed and are negative.      Objective:   Physical Exam Vitals signs and nursing note reviewed.  Constitutional:      General: She is not in acute distress.    Appearance: Normal appearance. She is well-developed and normal weight.  HENT:     Head: Normocephalic.     Nose: Nose normal.  Eyes:     Pupils: Pupils are equal, round, and reactive to light.  Neck:     Musculoskeletal: Normal  range of motion and neck supple.     Vascular: No carotid bruit or JVD.  Cardiovascular:     Rate and Rhythm: Normal rate and regular rhythm.     Heart sounds: Normal heart sounds.  Pulmonary:     Effort: Pulmonary effort is normal. No respiratory distress.     Breath sounds: Normal breath sounds. No wheezing or rales.  Chest:     Chest wall: No tenderness.  Abdominal:     General: Bowel sounds are normal. There is no distension or abdominal bruit.     Palpations: Abdomen is soft. There is no hepatomegaly, splenomegaly, mass or pulsatile mass.     Tenderness: There is no abdominal tenderness.  Musculoskeletal: Normal range of motion.  Lymphadenopathy:     Cervical: No cervical adenopathy.  Skin:    General: Skin is warm and dry.  Neurological:     Mental Status: She is alert and oriented to person, place, and time.     Deep Tendon Reflexes: Reflexes are normal and symmetric.  Psychiatric:        Behavior: Behavior normal.        Thought Content: Thought content normal.        Judgment: Judgment normal.    BP 122/81   Pulse 75   Temp 97.6 F (36.4 C) (Oral)   Ht _0  (1.549 m)   Wt 168 lb (76.2 kg)   BMI 31.74 kg/m  Assessment & Plan:  PEGGYANN ZWIEFELHOFER comes in today with chief complaint of Annual Exam (No pap)   Diagnosis and orders addressed:  1. Annual physical exam - CMP14+EGFR - Lipid panel - CBC with Differential/Platelet - Thyroid Panel With TSH  2. Panic attacks Stress management - Vilazodone HCl (VIIBRYD) 40 MG TABS; TAKE 1/2 TABLETS (20 MG TOTAL) BY MOUTH DAILY.  Dispense: 90 tablet; Refill: 1  3. GAD (generalized anxiety disorder) - Vilazodone HCl (VIIBRYD) 40 MG TABS; TAKE 1/2 TABLETS (20 MG TOTAL) BY MOUTH DAILY.  Dispense: 90 tablet; Refill: 1   Labs pending Health Maintenance reviewed Diet and exercise encouraged  Follow up plan: 6 months   Mary-Margaret Hassell Done, FNP

## 2018-07-16 NOTE — Patient Instructions (Signed)

## 2018-07-17 LAB — CMP14+EGFR
ALBUMIN: 4.2 g/dL (ref 3.8–4.8)
ALT: 19 IU/L (ref 0–32)
AST: 22 IU/L (ref 0–40)
Albumin/Globulin Ratio: 1.6 (ref 1.2–2.2)
Alkaline Phosphatase: 73 IU/L (ref 39–117)
BILIRUBIN TOTAL: 0.4 mg/dL (ref 0.0–1.2)
BUN/Creatinine Ratio: 9 (ref 9–23)
BUN: 8 mg/dL (ref 6–24)
CALCIUM: 8.9 mg/dL (ref 8.7–10.2)
CHLORIDE: 106 mmol/L (ref 96–106)
CO2: 24 mmol/L (ref 20–29)
Creatinine, Ser: 0.86 mg/dL (ref 0.57–1.00)
GFR, EST AFRICAN AMERICAN: 95 mL/min/{1.73_m2} (ref >59)
GFR, EST NON AFRICAN AMERICAN: 82 mL/min/{1.73_m2} (ref >59)
Globulin, Total: 2.6 g/dL (ref 1.5–4.5)
Glucose: 81 mg/dL (ref 65–99)
POTASSIUM: 4.3 mmol/L (ref 3.5–5.2)
Sodium: 145 mmol/L — ABNORMAL HIGH (ref 134–144)
TOTAL PROTEIN: 6.8 g/dL (ref 6.0–8.5)

## 2018-07-17 LAB — THYROID PANEL WITH TSH
Free Thyroxine Index: 1.7 (ref 1.2–4.9)
T3 UPTAKE RATIO: 22 % — AB (ref 24–39)
T4 TOTAL: 7.6 ug/dL (ref 4.5–12.0)
TSH: 1.46 u[IU]/mL (ref 0.450–4.500)

## 2018-07-17 LAB — LIPID PANEL
CHOL/HDL RATIO: 3.2 ratio (ref 0.0–4.4)
Cholesterol, Total: 165 mg/dL (ref 100–199)
HDL: 52 mg/dL (ref >39)
LDL Calculated: 101 mg/dL — ABNORMAL HIGH (ref 0–99)
Triglycerides: 62 mg/dL (ref 0–149)
VLDL CHOLESTEROL CAL: 12 mg/dL (ref 5–40)

## 2018-07-17 LAB — CBC WITH DIFFERENTIAL/PLATELET
Basophils Absolute: 0 10*3/uL (ref 0.0–0.2)
Basos: 1 %
EOS (ABSOLUTE): 0.1 10*3/uL (ref 0.0–0.4)
EOS: 1 %
HEMATOCRIT: 38.9 % (ref 34.0–46.6)
HEMOGLOBIN: 13.4 g/dL (ref 11.1–15.9)
IMMATURE GRANULOCYTES: 0 %
Immature Grans (Abs): 0 10*3/uL (ref 0.0–0.1)
LYMPHS ABS: 1.6 10*3/uL (ref 0.7–3.1)
Lymphs: 24 %
MCH: 33.3 pg — ABNORMAL HIGH (ref 26.6–33.0)
MCHC: 34.4 g/dL (ref 31.5–35.7)
MCV: 97 fL (ref 79–97)
MONOS ABS: 0.5 10*3/uL (ref 0.1–0.9)
Monocytes: 8 %
Neutrophils Absolute: 4.4 10*3/uL (ref 1.4–7.0)
Neutrophils: 66 %
Platelets: 309 10*3/uL (ref 150–450)
RBC: 4.03 x10E6/uL (ref 3.77–5.28)
RDW: 11.8 % (ref 11.7–15.4)
WBC: 6.6 10*3/uL (ref 3.4–10.8)

## 2018-07-31 DIAGNOSIS — L738 Other specified follicular disorders: Secondary | ICD-10-CM | POA: Diagnosis not present

## 2018-11-15 ENCOUNTER — Encounter (INDEPENDENT_AMBULATORY_CARE_PROVIDER_SITE_OTHER): Payer: Self-pay

## 2019-01-15 ENCOUNTER — Ambulatory Visit: Payer: BLUE CROSS/BLUE SHIELD | Admitting: Nurse Practitioner

## 2019-01-30 ENCOUNTER — Telehealth: Payer: Self-pay | Admitting: *Deleted

## 2019-01-30 NOTE — Telephone Encounter (Signed)
Prior Auth for Viibryd 40mg  tab-In Process  Key: [id number] -   PA Case ID: [id number]    Your information has been submitted to [facility]. To check for an updated outcome later, reopen this PA request from your dashboard.  If [employer] has not responded to your request within 24 hours, contact [place] at [phone]. If you think there may be a problem with your PA request, use our live chat feature at the bottom right.

## 2019-02-01 NOTE — Telephone Encounter (Signed)
Prior Auth for Viibryd 40mg -DENIED   [facility] received a request for coverage of Viibryd 40MG  OR TABS for you. This is the initial adverse determination for this request. The request was denied because: Your plan approved GST SSRIs (T) criteria covers this drug when you meet one of the following requirements: - You have experienced an inadequate treatment response after at least a 30 day trial of at least one generic Selective Serotonin Reuptake Inhibitor (SSRI) - You have a documented contraindication or potential drug interaction with at least one generic Selective Serotonin Reuptake Inhibitor (SSRI) - You have experienced an intolerance to at least one generic Selective Serotonin Reuptake Inhibitor (SSRI)

## 2019-02-01 NOTE — Telephone Encounter (Signed)
Patient has tried zoloft and lexapro in past with adverse side effects Has been on viibryd and is doing well

## 2019-02-01 NOTE — Telephone Encounter (Signed)
Letter written and signed by MMM and faxed for appeal.

## 2019-02-04 ENCOUNTER — Telehealth: Payer: Self-pay | Admitting: *Deleted

## 2019-02-04 NOTE — Telephone Encounter (Signed)
VM from [place] appeals Needing missing information [phone]

## 2019-02-04 NOTE — Telephone Encounter (Signed)
[facility] just needed to make sure the zoloft and lexapro pt had adverse reaction to was generic. Advised we always send for generic rx.

## 2019-02-06 NOTE — Telephone Encounter (Signed)
Appeal for Viibryd 40mg  tab-APPROVED   Dates approved for 02/04/2019 through 02/03/2021

## 2019-02-22 DIAGNOSIS — Z1231 Encounter for screening mammogram for malignant neoplasm of breast: Secondary | ICD-10-CM | POA: Diagnosis not present

## 2019-02-22 DIAGNOSIS — Z6830 Body mass index (BMI) 30.0-30.9, adult: Secondary | ICD-10-CM | POA: Diagnosis not present

## 2019-02-22 DIAGNOSIS — Z01419 Encounter for gynecological examination (general) (routine) without abnormal findings: Secondary | ICD-10-CM | POA: Diagnosis not present

## 2019-06-18 ENCOUNTER — Encounter: Payer: Self-pay | Admitting: Family

## 2019-06-18 ENCOUNTER — Ambulatory Visit (INDEPENDENT_AMBULATORY_CARE_PROVIDER_SITE_OTHER): Payer: BC Managed Care – PPO | Admitting: Family

## 2019-06-18 DIAGNOSIS — J019 Acute sinusitis, unspecified: Secondary | ICD-10-CM | POA: Diagnosis not present

## 2019-06-18 MED ORDER — AMOXICILLIN-POT CLAVULANATE 875-125 MG PO TABS: 1.0000 | ORAL_TABLET | Freq: Two times a day (BID) | ORAL | 0 refills | Status: DC

## 2019-06-18 NOTE — Progress Notes (Signed)
   Virtual Visit via telephone Note Due to COVID-19 pandemic this visit was conducted virtually. This visit type was conducted due to national recommendations for restrictions regarding the COVID-19 Pandemic (e.g. social distancing, sheltering in place) in an effort to limit this patient's exposure and mitigate transmission in our community. All issues noted in this document were discussed and addressed.  A physical exam was not performed with this format.  I connected with Danielle Wheeler on 06/18/19 at 3:20 pm by telephone and verified that I am speaking with the correct person using two identifiers. Danielle Wheeler is currently located at home and no one is currently with her during visit. The provider, Jannifer Rodney, FNP is located in their office at time of visit.  I discussed the limitations, risks, security and privacy concerns of performing an evaluation and management service by telephone and the availability of in person appointments. I also discussed with the patient that there may be a patient responsible charge related to this service. The patient expressed understanding and agreed to proceed.   History and Present Illness:  Sinusitis This is a new problem. The current episode started 1 to 4 weeks ago. The problem has been gradually worsening since onset. There has been no fever. The pain is mild. Associated symptoms include congestion, headaches, sinus pressure and sneezing. Pertinent negatives include no coughing, ear pain or sore throat. Past treatments include oral decongestants. The treatment provided mild relief.      Review of Systems  HENT: Positive for congestion, sinus pressure and sneezing. Negative for ear pain and sore throat.   Respiratory: Negative for cough.   Neurological: Positive for headaches.  All other systems reviewed and are negative.    Observations/Objective: No SOB or distress noted   Assessment and Plan: Danielle Wheeler comes in today with chief  complaint of No chief complaint on file.   Diagnosis and orders addressed:  1. Acute sinusitis, recurrence not specified, unspecified location - Take meds as prescribed - Use a cool mist humidifier  -Use saline nose sprays frequently -Force fluids -For any cough or congestion  Use plain Mucinex- regular strength or max strength is fine -For fever or aces or pains- take tylenol or ibuprofen. -Throat lozenges if help -RTO if symptoms worsen or do not improve  - amoxicillin-clavulanate (AUGMENTIN) 875-125 MG tablet; Take 1 tablet by mouth 2 (two) times daily.  Dispense: 14 tablet; Refill: 0     I discussed the assessment and treatment plan with the patient. The patient was provided an opportunity to ask questions and all were answered. The patient agreed with the plan and demonstrated an understanding of the instructions.   The patient was advised to call back or seek an in-person evaluation if the symptoms worsen or if the condition fails to improve as anticipated.  The above assessment and management plan was discussed with the patient. The patient verbalized understanding of and has agreed to the management plan. Patient is aware to call the clinic if symptoms persist or worsen. Patient is aware when to return to the clinic for a follow-up visit. Patient educated on when it is appropriate to go to the emergency department.   Time call ended:  3:30 pm  I provided 10 minutes of non-face-to-face time during this encounter.    Jannifer Rodney, FNP

## 2019-07-26 ENCOUNTER — Other Ambulatory Visit: Payer: Self-pay | Admitting: Nurse Practitioner

## 2019-07-26 ENCOUNTER — Telehealth (INDEPENDENT_AMBULATORY_CARE_PROVIDER_SITE_OTHER): Payer: BC Managed Care – PPO | Admitting: Nurse Practitioner

## 2019-07-26 ENCOUNTER — Encounter: Payer: Self-pay | Admitting: Nurse Practitioner

## 2019-07-26 ENCOUNTER — Telehealth: Payer: Self-pay

## 2019-07-26 DIAGNOSIS — F410 Panic disorder [episodic paroxysmal anxiety] without agoraphobia: Secondary | ICD-10-CM | POA: Diagnosis not present

## 2019-07-26 DIAGNOSIS — F411 Generalized anxiety disorder: Secondary | ICD-10-CM | POA: Diagnosis not present

## 2019-07-26 MED ORDER — VIIBRYD 40 MG PO TABS: ORAL_TABLET | ORAL | 1 refills | Status: DC

## 2019-07-26 NOTE — Telephone Encounter (Signed)
Televisit made with mmm today

## 2019-07-26 NOTE — Progress Notes (Signed)
Virtual Visit via video Note   Due to COVID-19 pandemic this visit was conducted virtually. This visit type was conducted due to national recommendations for restrictions regarding the COVID-19 Pandemic (e.g. social distancing, sheltering in place) in an effort to limit this patient's exposure and mitigate transmission in our community. All issues noted in this document were discussed and addressed.  A physical exam was not performed with this format.  I connected with Danielle Wheeler on 07/26/19 at 11:50 by video and verified that I am speaking with the correct person using two identifiers. Danielle Wheeler is currently located at [facility] and no ne is currently with  her during visit. The provider, Mary-Margaret Hassell Done, FNP is located in their office at time of visit.  I discussed the limitations, risks, security and privacy concerns of performing an evaluation and management service by telephone and the availability of in person appointments. I also discussed with the patient that there may be a patient responsible charge related to this service. The patient expressed understanding and agreed to proceed.   History and Present Illness:   Chief Complaint: Medical Management of Chronic Issues    HPI:  1. GAD (generalized anxiety disorder) Patient is doing well. She is on viibryd daily. She takes a 1/2 tablet daily and it keeps her calm.  2. Panic attacks Has not had any recent panic attacks.    Outpatient Encounter Medications as of 07/26/2019  Medication Sig  . cetirizine (ZYRTEC) 10 MG tablet Take 1 tablet (10 mg total) by mouth daily. (Patient not taking: Reported on 02/19/2016)  . fluticasone (FLONASE) 50 MCG/ACT nasal spray Place 2 sprays into the nose daily. (Patient not taking: Reported on 02/19/2016)  . Vilazodone HCl (VIIBRYD) 40 MG TABS TAKE 1/2 TABLETS (20 MG TOTAL) BY MOUTH DAILY.  . [DISCONTINUED] amoxicillin-clavulanate (AUGMENTIN) 875-125 MG tablet Take 1 tablet by mouth 2  (two) times daily.     Past Surgical History:  Procedure Laterality Date  . TONSILLECTOMY      No family history on file.  New complaints: None today  Social history: Lives with her husband  Controlled substance contract: n/a    Review of Systems  Constitutional: Negative for diaphoresis and weight loss.  Eyes: Negative for blurred vision, double vision and pain.  Respiratory: Negative for shortness of breath.   Cardiovascular: Negative for chest pain, palpitations, orthopnea and leg swelling.  Gastrointestinal: Negative for abdominal pain.  Skin: Negative for rash.  Neurological: Negative for dizziness, sensory change, loss of consciousness, weakness and headaches.  Endo/Heme/Allergies: Negative for polydipsia. Does not bruise/bleed easily.  Psychiatric/Behavioral: Negative for memory loss. The patient does not have insomnia.   All other systems reviewed and are negative.      Observations/Objective: Alert and oriented- answers all questions appropriately No distress    Assessment and Plan: Danielle Wheeler in today with chief complaint of Medical Management of Chronic Issues   1. GAD (generalized anxiety disorder) Stress management - Vilazodone HCl (VIIBRYD) 40 MG TABS; TAKE 1/2 TABLETS (20 MG TOTAL) BY MOUTH DAILY.  Dispense: 90 tablet; Refill: 1  2. Panic attacks - Vilazodone HCl (VIIBRYD) 40 MG TABS; TAKE 1/2 TABLETS (20 MG TOTAL) BY MOUTH DAILY.  Dispense: 90 tablet; Refill: 1     Follow Up Instructions: 6 months    I discussed the assessment and treatment plan with the patient. The patient was provided an opportunity to ask questions and all were answered. The patient agreed with the plan and demonstrated  an understanding of the instructions.   The patient was advised to call back or seek an in-person evaluation if the symptoms worsen or if the condition fails to improve as anticipated.  The above assessment and management plan was discussed with  the patient. The patient verbalized understanding of and has agreed to the management plan. Patient is aware to call the clinic if symptoms persist or worsen. Patient is aware when to return to the clinic for a follow-up visit. Patient educated on when it is appropriate to go to the emergency department.   Time call ended: 12:00  I provided 10 minutes of face-to-face time during this encounter.    Mary-Margaret Daphine Deutscher, FNP

## 2019-07-26 NOTE — Telephone Encounter (Signed)
Refill for Viibryd denied. Pt has not been seen for depression in one year. There is no follow up appointment scheduled at this time. Alerted pharmacy that pt needed to call the office for an appointment.

## 2019-09-03 DIAGNOSIS — J069 Acute upper respiratory infection, unspecified: Secondary | ICD-10-CM | POA: Diagnosis not present

## 2019-09-03 DIAGNOSIS — Z6830 Body mass index (BMI) 30.0-30.9, adult: Secondary | ICD-10-CM | POA: Diagnosis not present

## 2019-09-03 DIAGNOSIS — J029 Acute pharyngitis, unspecified: Secondary | ICD-10-CM | POA: Diagnosis not present

## 2019-11-26 DIAGNOSIS — M545 Low back pain: Secondary | ICD-10-CM | POA: Diagnosis not present

## 2019-11-26 DIAGNOSIS — M5416 Radiculopathy, lumbar region: Secondary | ICD-10-CM | POA: Diagnosis not present

## 2020-01-06 DIAGNOSIS — M545 Low back pain: Secondary | ICD-10-CM | POA: Diagnosis not present

## 2020-01-06 DIAGNOSIS — M5416 Radiculopathy, lumbar region: Secondary | ICD-10-CM | POA: Diagnosis not present

## 2020-01-18 DIAGNOSIS — M545 Low back pain: Secondary | ICD-10-CM | POA: Diagnosis not present

## 2020-01-31 DIAGNOSIS — M5416 Radiculopathy, lumbar region: Secondary | ICD-10-CM | POA: Diagnosis not present

## 2020-01-31 DIAGNOSIS — M7138 Other bursal cyst, other site: Secondary | ICD-10-CM | POA: Diagnosis not present

## 2020-02-05 DIAGNOSIS — M5416 Radiculopathy, lumbar region: Secondary | ICD-10-CM | POA: Diagnosis not present

## 2020-02-05 DIAGNOSIS — M7138 Other bursal cyst, other site: Secondary | ICD-10-CM | POA: Diagnosis not present

## 2020-04-16 DIAGNOSIS — G96191 Perineural cyst: Secondary | ICD-10-CM | POA: Diagnosis not present

## 2020-04-16 DIAGNOSIS — M5416 Radiculopathy, lumbar region: Secondary | ICD-10-CM | POA: Diagnosis not present

## 2020-04-22 DIAGNOSIS — Z20822 Contact with and (suspected) exposure to covid-19: Secondary | ICD-10-CM | POA: Diagnosis not present

## 2020-04-22 DIAGNOSIS — Z01812 Encounter for preprocedural laboratory examination: Secondary | ICD-10-CM | POA: Diagnosis not present

## 2020-04-22 DIAGNOSIS — M5416 Radiculopathy, lumbar region: Secondary | ICD-10-CM | POA: Diagnosis not present

## 2020-04-29 DIAGNOSIS — F419 Anxiety disorder, unspecified: Secondary | ICD-10-CM | POA: Diagnosis not present

## 2020-04-29 DIAGNOSIS — R519 Headache, unspecified: Secondary | ICD-10-CM | POA: Diagnosis not present

## 2020-04-29 DIAGNOSIS — M5417 Radiculopathy, lumbosacral region: Secondary | ICD-10-CM | POA: Diagnosis not present

## 2020-04-29 DIAGNOSIS — G96191 Perineural cyst: Secondary | ICD-10-CM | POA: Diagnosis not present

## 2020-04-29 DIAGNOSIS — M5416 Radiculopathy, lumbar region: Secondary | ICD-10-CM | POA: Diagnosis not present

## 2020-04-29 HISTORY — PX: BACK SURGERY: SHX140

## 2020-06-30 DIAGNOSIS — R0981 Nasal congestion: Secondary | ICD-10-CM | POA: Diagnosis not present

## 2020-06-30 DIAGNOSIS — J029 Acute pharyngitis, unspecified: Secondary | ICD-10-CM | POA: Diagnosis not present

## 2020-06-30 DIAGNOSIS — R52 Pain, unspecified: Secondary | ICD-10-CM | POA: Diagnosis not present

## 2020-06-30 DIAGNOSIS — R059 Cough, unspecified: Secondary | ICD-10-CM | POA: Diagnosis not present

## 2020-07-06 ENCOUNTER — Other Ambulatory Visit: Payer: Self-pay | Admitting: Nurse Practitioner

## 2020-07-06 DIAGNOSIS — F411 Generalized anxiety disorder: Secondary | ICD-10-CM

## 2020-07-06 DIAGNOSIS — F410 Panic disorder [episodic paroxysmal anxiety] without agoraphobia: Secondary | ICD-10-CM

## 2020-08-04 ENCOUNTER — Other Ambulatory Visit: Payer: Self-pay | Admitting: Nurse Practitioner

## 2020-08-04 DIAGNOSIS — F410 Panic disorder [episodic paroxysmal anxiety] without agoraphobia: Secondary | ICD-10-CM

## 2020-08-04 DIAGNOSIS — F411 Generalized anxiety disorder: Secondary | ICD-10-CM

## 2020-08-17 ENCOUNTER — Other Ambulatory Visit: Payer: Self-pay

## 2020-08-17 ENCOUNTER — Ambulatory Visit: Payer: BC Managed Care – PPO | Admitting: Nurse Practitioner

## 2020-08-17 ENCOUNTER — Encounter: Payer: Self-pay | Admitting: Nurse Practitioner

## 2020-08-17 VITALS — BP 127/88 | HR 71 | Temp 99.1°F | Resp 20 | Ht 61.0 in | Wt 178.0 lb

## 2020-08-17 DIAGNOSIS — F411 Generalized anxiety disorder: Secondary | ICD-10-CM

## 2020-08-17 DIAGNOSIS — F410 Panic disorder [episodic paroxysmal anxiety] without agoraphobia: Secondary | ICD-10-CM

## 2020-08-17 MED ORDER — VIIBRYD 40 MG PO TABS: 20.0000 mg | ORAL_TABLET | Freq: Every day | ORAL | 1 refills | Status: DC

## 2020-08-17 NOTE — Patient Instructions (Signed)
Textbook of family medicine (9th ed., pp. 1062-1073). [place], [place]: [employer].">  Stress, Adult Stress is a normal reaction to life events. Stress is what you feel when life demands more than you are used to, or more than you think you can handle. Some stress can be useful, such as studying for a test or meeting a deadline at work. Stress that occurs too often or for too long can cause problems. It can affect your emotional health and interfere with relationships and normal daily activities. Too much stress can weaken your body's defense system (immune system) and increase your risk for physical illness. If you already have a medical problem, stress can make it worse. What are the causes? All sorts of life events can cause stress. An event that causes stress for one person may not be stressful for another person. Major life events, whether positive or negative, commonly cause stress. Examples include:  Losing a job or starting a new job.  Losing a loved one.  Moving to a new town or home.  Getting married or divorced.  Having a baby.  Getting injured or sick. Less obvious life events can also cause stress, especially if they occur day after day or in combination with each other. Examples include:  Working long hours.  Driving in traffic.  Caring for children.  Being in debt.  Being in a difficult relationship. What are the signs or symptoms? Stress can cause emotional symptoms, including:  Anxiety. This is feeling worried, afraid, on edge, overwhelmed, or out of control.  Anger, including irritation or impatience.  Depression. This is feeling sad, down, helpless, or guilty.  Trouble focusing, remembering, or making decisions. Stress can cause physical symptoms, including:  Aches and pains. These may affect your head, neck, back, stomach, or other areas of your body.  Tight muscles or a clenched jaw.  Low energy.  Trouble sleeping. Stress can cause unhealthy  behaviors, including:  Eating to feel better (overeating) or skipping meals.  Working too much or putting off tasks.  Smoking, drinking alcohol, or using drugs to feel better. How is this diagnosed? Stress is diagnosed through an assessment by your health care provider. He or she may diagnose this condition based on:  Your symptoms and any stressful life events.  Your medical history.  Tests to rule out other causes of your symptoms. Depending on your condition, your health care provider may refer you to a specialist for further evaluation. How is this treated? Stress management techniques are the recommended treatment for stress. Medicine is not typically recommended for the treatment of stress. Techniques to reduce your reaction to stressful life events include:  Stress identification. Monitor yourself for symptoms of stress and identify what causes stress for you. These skills may help you to avoid or prepare for stressful events.  Time management. Set your priorities, keep a calendar of events, and learn to say no. Taking these actions can help you avoid making too many commitments. Techniques for coping with stress include:  Rethinking the problem. Try to think realistically about stressful events rather than ignoring them or overreacting. Try to find the positives in a stressful situation rather than focusing on the negatives.  Exercise. Physical exercise can release both physical and emotional tension. The key is to find a form of exercise that you enjoy and do it regularly.  Relaxation techniques. These relax the body and mind. The key is to find one or more that you enjoy and use the techniques regularly. Examples include: ?   Meditation, deep breathing, or progressive relaxation techniques. ? Yoga or tai chi. ? Biofeedback, mindfulness techniques, or journaling. ? Listening to music, being out in nature, or participating in other hobbies.  Practicing a healthy lifestyle.  Eat a balanced diet, drink plenty of water, limit or avoid caffeine, and get plenty of sleep.  Having a strong support network. Spend time with family, friends, or other people you enjoy being around. Express your feelings and talk things over with someone you trust. Counseling or talk therapy with a mental health professional may be helpful if you are having trouble managing stress on your own.   Follow these instructions at home: Lifestyle  Avoid drugs.  Do not use any products that contain nicotine or tobacco, such as cigarettes, e-cigarettes, and chewing tobacco. If you need help quitting, ask your health care provider.  Limit alcohol intake to no more than 1 drink a day for nonpregnant women and 2 drinks a day for men. One drink equals 12 oz of beer, 5 oz of wine, or 1 oz of hard liquor  Do not use alcohol or drugs to relax.  Eat a balanced diet that includes fresh fruits and vegetables, whole grains, lean meats, fish, eggs, and beans, and low-fat dairy. Avoid processed foods and foods high in added fat, sugar, and salt.  Exercise at least 30 minutes on 5 or more days each week.  Get 7-8 hours of sleep each night.   General instructions  Practice stress management techniques as discussed with your health care provider.  Drink enough fluid to keep your urine clear or pale yellow.  Take over-the-counter and prescription medicines only as told by your health care provider.  Keep all follow-up visits as told by your health care provider. This is important.   Contact a health care provider if:  Your symptoms get worse.  You have new symptoms.  You feel overwhelmed by your problems and can no longer manage them on your own. Get help right away if:  You have thoughts of hurting yourself or others. If you ever feel like you may hurt yourself or others, or have thoughts about taking your own life, get help right away. You can go to your nearest emergency department or  call:  Your local emergency services (911 in the [place]  A suicide crisis helpline, such as the National Suicide Prevention Lifeline at 1-800-273-8255. This is open 24 hours a day. Summary  Stress is a normal reaction to life events. It can cause problems if it happens too often or for too long.  Practicing stress management techniques is the best way to treat stress.  Counseling or talk therapy with a mental health professional may be helpful if you are having trouble managing stress on your own. This information is not intended to replace advice given to you by your health care provider. Make sure you discuss any questions you have with your health care provider. Document Revised: 02/07/2020 Document Reviewed: 02/07/2020 Elsevier Patient Education  2021 [employer]  

## 2020-08-17 NOTE — Progress Notes (Signed)
Subjective:    Patient ID: Danielle Wheeler, female    DOB: November 25, 1975, 45 y.o.   MRN: [id number]   Chief Complaint: Medical Management of Chronic Issues    HPI:  1. GAD (generalized anxiety disorder)  2. Panic attacks Patient is currently on viibryd. She says she is doing well on it. Denies any medication side effects. Depression screen [facility] 2/9 08/17/2020 07/26/2019 07/16/2018  Decreased Interest 0 0 0  Down, Depressed, Hopeless 0 0 0  PHQ - 2 Score 0 0 0  Altered sleeping 0 - -  Tired, decreased energy 0 - -  Change in appetite 0 - -  Feeling bad or failure about yourself  0 - -  Trouble concentrating 0 - -  Moving slowly or fidgety/restless 0 - -  Suicidal thoughts 0 - -  PHQ-9 Score 0 - -  Difficult doing work/chores Not difficult at all - -       Outpatient Encounter Medications as of 08/17/2020  Medication Sig  . Vilazodone HCl (VIIBRYD) 40 MG TABS Take 0.5 tablets (20 mg total) by mouth daily. (Needs to be seen before next refill)  . cetirizine (ZYRTEC) 10 MG tablet Take 1 tablet (10 mg total) by mouth daily. (Patient not taking: No sig reported)  . fluticasone (FLONASE) 50 MCG/ACT nasal spray Place 2 sprays into the nose daily. (Patient not taking: No sig reported)     Past Surgical History:  Procedure Laterality Date  . BACK SURGERY  04/29/2020  . TONSILLECTOMY      Family History  Problem Relation Age of Onset  . Hypertension Mother   . Diabetes Mother   . Atrial fibrillation Mother   . Diabetes Father   . Heart disease Father     New complaints: None today  Social history: Lives with her husband  Controlled substance contract: n/a    Review of Systems  Constitutional: Negative for diaphoresis.  Eyes: Negative for pain.  Respiratory: Negative for shortness of breath.   Cardiovascular: Negative for chest pain, palpitations and leg swelling.  Gastrointestinal: Negative for abdominal pain.  Endocrine: Negative for polydipsia.  Skin: Negative  for rash.  Neurological: Negative for dizziness, weakness and headaches.  Hematological: Does not bruise/bleed easily.  All other systems reviewed and are negative.      Objective:   Physical Exam Vitals and nursing note reviewed.  Constitutional:      General: She is not in acute distress.    Appearance: Normal appearance. She is well-developed.  HENT:     Head: Normocephalic.     Nose: Nose normal.  Eyes:     Pupils: Pupils are equal, round, and reactive to light.  Neck:     Vascular: No carotid bruit or JVD.  Cardiovascular:     Rate and Rhythm: Normal rate and regular rhythm.     Heart sounds: Normal heart sounds.  Pulmonary:     Effort: Pulmonary effort is normal. No respiratory distress.     Breath sounds: Normal breath sounds. No wheezing or rales.  Chest:     Chest wall: No tenderness.  Abdominal:     General: Bowel sounds are normal. There is no distension or abdominal bruit.     Palpations: Abdomen is soft. There is no hepatomegaly, splenomegaly, mass or pulsatile mass.     Tenderness: There is no abdominal tenderness.  Musculoskeletal:        General: Normal range of motion.     Cervical back: Normal range of  motion and neck supple.  Lymphadenopathy:     Cervical: No cervical adenopathy.  Skin:    General: Skin is warm and dry.  Neurological:     Mental Status: She is alert and oriented to person, place, and time.     Deep Tendon Reflexes: Reflexes are normal and symmetric.  Psychiatric:        Behavior: Behavior normal.        Thought Content: Thought content normal.        Judgment: Judgment normal.     BP 127/88   Pulse 71   Temp 99.1 F (37.3 C) (Temporal)   Resp 20   Ht 5\' 1"  (1.549 m)   Wt 178 lb (80.7 kg)   SpO2 100%   BMI 33.63 kg/m        Assessment & Plan:  in today with chief complaint of Medical Management of Chronic Issues   1. GAD (generalized anxiety disorder) - Vilazodone HCl (VIIBRYD) 40 MG TABS; Take 0.5  tablets (20 mg total) by mouth daily. (Needs to be seen before next refill)  Dispense: 90 tablet; Refill: 1  2. Panic attacks Stress management - Vilazodone HCl (VIIBRYD) 40 MG TABS; Take 0.5 tablets (20 mg total) by mouth daily. (Needs to be seen before next refill)  Dispense: 90 tablet; Refill: 1    The above assessment and management plan was discussed with the patient. The patient verbalized understanding of and has agreed to the management plan. Patient is aware to call the clinic if symptoms persist or worsen. Patient is aware when to return to the clinic for a follow-up visit. Patient educated on when it is appropriate to go to the emergency department.   Danielle Danielle Mcmurray, FNP

## 2020-11-10 DIAGNOSIS — Z6831 Body mass index (BMI) 31.0-31.9, adult: Secondary | ICD-10-CM | POA: Diagnosis not present

## 2020-11-10 DIAGNOSIS — L03115 Cellulitis of right lower limb: Secondary | ICD-10-CM | POA: Diagnosis not present

## 2021-02-09 DIAGNOSIS — Z01419 Encounter for gynecological examination (general) (routine) without abnormal findings: Secondary | ICD-10-CM | POA: Diagnosis not present

## 2021-02-09 DIAGNOSIS — Z6832 Body mass index (BMI) 32.0-32.9, adult: Secondary | ICD-10-CM | POA: Diagnosis not present

## 2021-02-09 DIAGNOSIS — Z1231 Encounter for screening mammogram for malignant neoplasm of breast: Secondary | ICD-10-CM | POA: Diagnosis not present

## 2021-02-19 ENCOUNTER — Ambulatory Visit: Payer: BC Managed Care – PPO | Admitting: Nurse Practitioner

## 2021-04-23 DIAGNOSIS — D120 Benign neoplasm of cecum: Secondary | ICD-10-CM | POA: Diagnosis not present

## 2021-04-23 DIAGNOSIS — Z1211 Encounter for screening for malignant neoplasm of colon: Secondary | ICD-10-CM | POA: Diagnosis not present

## 2021-04-23 DIAGNOSIS — K635 Polyp of colon: Secondary | ICD-10-CM | POA: Diagnosis not present

## 2021-08-17 ENCOUNTER — Ambulatory Visit: Payer: BC Managed Care – PPO | Admitting: Nurse Practitioner

## 2021-08-17 ENCOUNTER — Encounter: Payer: Self-pay | Admitting: Nurse Practitioner

## 2021-08-17 VITALS — BP 114/81 | HR 67 | Temp 97.2°F | Resp 20 | Ht 61.0 in | Wt 177.0 lb

## 2021-08-17 DIAGNOSIS — F410 Panic disorder [episodic paroxysmal anxiety] without agoraphobia: Secondary | ICD-10-CM

## 2021-08-17 DIAGNOSIS — F411 Generalized anxiety disorder: Secondary | ICD-10-CM | POA: Diagnosis not present

## 2021-08-17 MED ORDER — VILAZODONE HCL 40 MG PO TABS: 20.0000 mg | ORAL_TABLET | Freq: Every day | ORAL | 1 refills | Status: DC

## 2021-08-17 NOTE — Patient Instructions (Signed)
Stress, Adult °Stress is a normal reaction to life events. Stress is what you feel when life demands more than you are used to, or more than you think you can handle. °Some stress can be useful, such as studying for a test or meeting a deadline at work. Stress that occurs too often or for too long can cause problems. Long-lasting stress is called chronic stress. Chronic stress can affect your emotional health and interfere with relationships and normal daily activities. °Too much stress can weaken your body's defense system (immune system) and increase your risk for physical illness. If you already have a medical problem, stress can make it worse. °What are the causes? °All sorts of life events can cause stress. An event that causes stress for one person may not be stressful for someone else. Major life events, whether positive or negative, commonly cause stress. Examples include: °Losing a job or starting a new job. °Losing a loved one. °Moving to a new town or home. °Getting married or divorced. °Having a baby. °Getting injured or sick. °Less obvious life events can also cause stress, especially if they occur day after day or in combination with each other. Examples include: °Working long hours. °Driving in traffic. °Caring for children. °Being in debt. °Being in a difficult relationship. °What are the signs or symptoms? °Stress can cause emotional and physical symptoms and can lead to unhealthy behaviors. These include the following: °Emotional symptoms °Anxiety. This is feeling worried, afraid, on edge, overwhelmed, or out of control. °Anger, including irritation or impatience. °Depression. This is feeling sad, down, helpless, or guilty. °Trouble focusing, remembering, or making decisions. °Physical symptoms °Aches and pains. These may affect your head, neck, back, stomach, or other areas of your body. °Tight muscles or a clenched jaw. °Low energy. °Trouble sleeping. °Unhealthy behaviors °Eating to feel better  (overeating) or skipping meals. °Working too much or putting off tasks. °Smoking, drinking alcohol, or using drugs to feel better. °How is this diagnosed? °A stress disorder is diagnosed through an assessment by your health care provider. A stress disorder may be diagnosed based on: °Your symptoms and any stressful life events. °Your medical history. °Tests to rule out other causes of your symptoms. °Depending on your condition, your health care provider may refer you to a specialist for further evaluation. °How is this treated? °Stress management techniques are the recommended treatment for stress. Medicine is not typically recommended for treating stress. °Techniques to reduce your reaction to stressful life events include: °Identifying stress. Monitor yourself for symptoms of stress and notice what causes stress for you. These skills may help you to avoid or prepare for stressful events. °Managing time. Set your priorities, keep a calendar of events, and learn to say no. These actions can help you avoid taking on too much. °Techniques for dealing with stress include: °Rethinking the problem. Try to think realistically about stressful events rather than ignoring them or overreacting. Try to find the positives in a stressful situation rather than focusing on the negatives. °Exercise. Physical exercise can release both physical and emotional tension. The key is to find a form of exercise that you enjoy and do it regularly. °Relaxation techniques. These relax the body and mind. Find one or more that you enjoy and use the techniques regularly. Examples include: °Meditation, deep breathing, or progressive relaxation techniques. °Yoga or tai chi. °Biofeedback, mindfulness techniques, or journaling. °Listening to music, being in nature, or taking part in other hobbies. °Practicing a healthy lifestyle. Eat a balanced diet,   drink plenty of water, limit or avoid caffeine, and get plenty of sleep. °Having a strong support  network. Spend time with family, friends, or other people you enjoy being around. Express your feelings and talk things over with someone you trust. °Counseling or talk therapy with a mental health provider may help if you are having trouble managing stress by yourself. °Follow these instructions at home: °Lifestyle ° °Avoid drugs. °Do not use any products that contain nicotine or tobacco. These products include cigarettes, chewing tobacco, and vaping devices, such as e-cigarettes. If you need help quitting, ask your health care provider. °If you drink alcohol: °Limit how much you have to: °0-1 drink a day for women who are not pregnant. °0-2 drinks a day for men. °Know how much alcohol is in a drink. In the [place] one drink equals one 12 oz bottle of beer (355 mL), one 5 oz glass of wine (148 mL), or one 1½ oz glass of hard liquor (44 mL). °Do not use alcohol or drugs to relax. °Eat a balanced diet that includes fresh fruits and vegetables, whole grains, lean meats, fish, eggs, beans, and low-fat dairy. Avoid processed foods and foods high in added fat, sugar, and salt. °Exercise at least 30 minutes on 5 or more days each week. °Get 7-8 hours of sleep each night. °General instructions ° °Practice stress management techniques as told by your health care provider. °Drink enough fluid to keep your urine pale yellow. °Take over-the-counter and prescription medicines only as told by your health care provider. °Keep all follow-up visits. This is important. °Contact a health care provider if: °Your symptoms get worse. °You have new symptoms. °You feel overwhelmed by your problems and can no longer manage them by yourself. °Get help right away if: °You have thoughts of hurting yourself or others. °Get help right awayif you feel like you may hurt yourself or others, or have thoughts about taking your own life. Go to your nearest emergency room or: °Call 911. °Call the National Suicide Prevention Lifeline at 1-800-273-8255 or  988. This is open 24 hours a day. °Text the Crisis Text Line at 741741. °Summary °Stress is a normal reaction to life events. It can cause problems if it happens too often or for too long. °Practicing stress management techniques is the best way to treat stress. °Counseling or talk therapy with a mental health provider may help if you are having trouble managing stress by yourself. °This information is not intended to replace advice given to you by your health care provider. Make sure you discuss any questions you have with your health care provider. °Document Revised: 12/31/2020 Document Reviewed: 12/31/2020 °Elsevier Patient Education © 2022 [employer] ° °

## 2021-08-17 NOTE — Progress Notes (Signed)
? ?Subjective:  ? ? Patient ID: Danielle Wheeler, female    DOB: Dec 27, 1975, 46 y.o.   MRN: [id number] ? ?Chief Complaint: Medical Management of Chronic Issues ?  ? ?HPI: ? ?Danielle Wheeler is a 46 y.o. who identifies as a female who was assigned female at birth.  ? ?Social history: ?Lives with: husband  ?Work history: [employer] ? ? ?Comes in today for follow up of the following chronic medical issues: ? ?1. GAD (generalized anxiety disorder) ?2. Panic attacks ?Has been on viibryd for several years. She says it keeps her calm and prevents panic attacks. She denies any medication side effects. ? ? ?New complaints: ?None today ? ?No Known Allergies ?Outpatient Encounter Medications as of 08/17/2021  ?Medication Sig  ? Vilazodone HCl (VIIBRYD) 40 MG TABS Take 0.5 tablets (20 mg total) by mouth daily. (Needs to be seen before next refill)  ? cetirizine (ZYRTEC) 10 MG tablet Take 1 tablet (10 mg total) by mouth daily. (Patient not taking: Reported on 02/19/2016)  ? fluticasone (FLONASE) 50 MCG/ACT nasal spray Place 2 sprays into the nose daily. (Patient not taking: Reported on 02/19/2016)  ? ?No facility-administered encounter medications on file as of 08/17/2021.  ? ? ?Past Surgical History:  ?Procedure Laterality Date  ? BACK SURGERY  04/29/2020  ? TONSILLECTOMY    ? ? ?Family History  ?Problem Relation Age of Onset  ? Hypertension Mother   ? Diabetes Mother   ? Atrial fibrillation Mother   ? Diabetes Father   ? Heart disease Father   ? ? ? ? ?Controlled substance contract: n/a ? ? ? ? ?Review of Systems  ?Constitutional:  Negative for diaphoresis.  ?Eyes:  Negative for pain.  ?Respiratory:  Negative for shortness of breath.   ?Cardiovascular:  Negative for chest pain, palpitations and leg swelling.  ?Gastrointestinal:  Negative for abdominal pain.  ?Endocrine: Negative for polydipsia.  ?Skin:  Negative for rash.  ?Neurological:  Negative for dizziness, weakness and headaches.  ?Hematological:  Does not bruise/bleed easily.   ?All other systems reviewed and are negative. ? ?   ?Objective:  ? Physical Exam ?Vitals and nursing note reviewed.  ?Constitutional:   ?   General: She is not in acute distress. ?   Appearance: Normal appearance. She is well-developed.  ?HENT:  ?   Head: Normocephalic.  ?   Right Ear: Tympanic membrane normal.  ?   Left Ear: Tympanic membrane normal.  ?   Nose: Nose normal.  ?   Mouth/Throat:  ?   Mouth: Mucous membranes are moist.  ?Eyes:  ?   Pupils: Pupils are equal, round, and reactive to light.  ?Neck:  ?   Vascular: No carotid bruit or JVD.  ?Cardiovascular:  ?   Rate and Rhythm: Normal rate and regular rhythm.  ?   Heart sounds: Normal heart sounds.  ?Pulmonary:  ?   Effort: Pulmonary effort is normal. No respiratory distress.  ?   Breath sounds: Normal breath sounds. No wheezing or rales.  ?Chest:  ?   Chest wall: No tenderness.  ?Abdominal:  ?   General: Bowel sounds are normal. There is no distension or abdominal bruit.  ?   Palpations: Abdomen is soft. There is no hepatomegaly, splenomegaly, mass or pulsatile mass.  ?   Tenderness: There is no abdominal tenderness.  ?Musculoskeletal:     ?   General: Normal range of motion.  ?   Cervical back: Normal range of motion and neck supple.  ?  Lymphadenopathy:  ?   Cervical: No cervical adenopathy.  ?Skin: ?   General: Skin is warm and dry.  ?Neurological:  ?   Mental Status: She is alert and oriented to person, place, and time.  ?   Deep Tendon Reflexes: Reflexes are normal and symmetric.  ?Psychiatric:     ?   Behavior: Behavior normal.     ?   Thought Content: Thought content normal.     ?   Judgment: Judgment normal.  ? ? ?BP 114/81   Pulse 67   Temp (!) 97.2 ?F (36.2 ?C) (Temporal)   Resp 20   Ht 5\' 1"  (1.549 m)   Wt 177 lb (80.3 kg)   SpO2 99%   BMI 33.44 kg/m?  ? ? ? ?   ?Assessment & Plan:  ? ?Danielle Wheeler comes in today with chief complaint of Medical Management of Chronic Issues ? ? ?Diagnosis and orders addressed: ? ?1. GAD (generalized  anxiety disorder) ?- Vilazodone HCl (VIIBRYD) 40 MG TABS; Take 0.5 tablets (20 mg total) by mouth daily. (Needs to be seen before next refill)  Dispense: 90 tablet; Refill: 1 ? ?2. Panic attacks ?- Vilazodone HCl (VIIBRYD) 40 MG TABS; Take 0.5 tablets (20 mg total) by mouth daily. (Needs to be seen before next refill)  Dispense: 90 tablet; Refill: 1 ?Stress manaegment ? ?Follow up plan: ?6 months ? ? ?Mary-Margaret Dorcas Mcmurray, FNP ? ?

## 2021-10-18 DIAGNOSIS — L298 Other pruritus: Secondary | ICD-10-CM | POA: Diagnosis not present

## 2021-10-18 DIAGNOSIS — B078 Other viral warts: Secondary | ICD-10-CM | POA: Diagnosis not present

## 2022-03-09 DIAGNOSIS — R3129 Other microscopic hematuria: Secondary | ICD-10-CM | POA: Diagnosis not present

## 2022-08-16 ENCOUNTER — Encounter: Payer: Self-pay | Admitting: Nurse Practitioner

## 2022-08-16 ENCOUNTER — Ambulatory Visit (INDEPENDENT_AMBULATORY_CARE_PROVIDER_SITE_OTHER): Payer: BC Managed Care – PPO | Admitting: Nurse Practitioner

## 2022-08-16 VITALS — BP 138/85 | HR 82 | Temp 97.4°F | Ht 61.0 in | Wt 178.4 lb

## 2022-08-16 DIAGNOSIS — Z23 Encounter for immunization: Secondary | ICD-10-CM | POA: Diagnosis not present

## 2022-08-16 DIAGNOSIS — F411 Generalized anxiety disorder: Secondary | ICD-10-CM

## 2022-08-16 DIAGNOSIS — Z0001 Encounter for general adult medical examination with abnormal findings: Secondary | ICD-10-CM

## 2022-08-16 DIAGNOSIS — F410 Panic disorder [episodic paroxysmal anxiety] without agoraphobia: Secondary | ICD-10-CM

## 2022-08-16 DIAGNOSIS — Z0000 Encounter for general adult medical examination without abnormal findings: Secondary | ICD-10-CM

## 2022-08-16 MED ORDER — VILAZODONE HCL 40 MG PO TABS: 20.0000 mg | ORAL_TABLET | Freq: Every day | ORAL | 1 refills | Status: DC

## 2022-08-16 NOTE — Patient Instructions (Signed)

## 2022-08-16 NOTE — Addendum Note (Signed)
Addended by: Chevis Pretty on: 08/16/2022 03:16 PM   Modules accepted: Level of Service

## 2022-08-16 NOTE — Addendum Note (Signed)
Addended by: Rolena Infante on: 08/16/2022 03:24 PM   Modules accepted: Orders

## 2022-08-16 NOTE — Progress Notes (Signed)
Subjective:    Patient ID: Danielle Wheeler, female    DOB: 06-Jun-1976, 47 y.o.   MRN: [id number]   Chief Complaint: annual physical   HPI:  Danielle Wheeler is a 47 y.o. who identifies as a female who was assigned female at birth.   Social history: Lives with: husband Work history: [employer]   Comes in today for follow up of the following chronic medical issues:  1. Annual physical exam LMP- IUD  2. GAD (generalized anxiety disorder) 3. Panic attacks Patient is on viibryd daily and is doing fairly well. She is currently on '20mg'$  daily and thinks she needs to up it to '40mg'$  daily.    08/16/2022    3:03 PM 08/17/2021    3:41 PM 08/17/2020    4:16 PM 07/26/2019   11:53 AM  GAD 7 : Generalized Anxiety Score  Nervous, Anxious, on Edge 0 0 0 1  Control/stop worrying 1 0 0 1  Worry too much - different things 0 0 0 0  Trouble relaxing 0 0 0 0  Restless 1 0 0 0  Easily annoyed or irritable 0 0 0 0  Afraid - awful might happen 0 0 0 0  Total GAD 7 Score 2 0 0 2  Anxiety Difficulty Not difficult at all Not difficult at all Not difficult at all Not difficult at all       08/16/2022    3:02 PM 08/17/2021    3:41 PM 08/17/2020    4:07 PM  Depression screen PHQ 2/9  Decreased Interest 2 0 0  Down, Depressed, Hopeless 0 0 0  PHQ - 2 Score 2 0 0  Altered sleeping 1 0 0  Tired, decreased energy 1 1 0  Change in appetite 2 0 0  Feeling bad or failure about yourself  0 0 0  Trouble concentrating 0 0 0  Moving slowly or fidgety/restless 0 0 0  Suicidal thoughts 0 0 0  PHQ-9 Score 6 1 0  Difficult doing work/chores Not difficult at all Not difficult at all Not difficult at all      New complaints: None today  No Known Allergies Outpatient Encounter Medications as of 08/16/2022  Medication Sig   cetirizine (ZYRTEC) 10 MG tablet Take 1 tablet (10 mg total) by mouth daily. (Patient not taking: Reported on 02/19/2016)   fluticasone (FLONASE) 50 MCG/ACT nasal spray  Place 2 sprays into the nose daily. (Patient not taking: Reported on 02/19/2016)   Vilazodone HCl (VIIBRYD) 40 MG TABS Take 0.5 tablets (20 mg total) by mouth daily. (Needs to be seen before next refill)   No facility-administered encounter medications on file as of 08/16/2022.    Past Surgical History:  Procedure Laterality Date   BACK SURGERY  04/29/2020   TONSILLECTOMY      Family History  Problem Relation Age of Onset   Hypertension Mother    Diabetes Mother    Atrial fibrillation Mother    Diabetes Father    Heart disease Father       Controlled substance contract: n/a     Review of Systems  Constitutional:  Negative for diaphoresis.  Eyes:  Negative for pain.  Respiratory:  Negative for shortness of breath.   Cardiovascular:  Negative for chest pain, palpitations and leg swelling.  Gastrointestinal:  Negative for abdominal pain.  Endocrine: Negative for polydipsia.  Skin:  Negative for rash.  Neurological:  Negative for dizziness, weakness and headaches.  Hematological:  Does  not bruise/bleed easily.  All other systems reviewed and are negative.      Objective:   Physical Exam Vitals and nursing note reviewed.  Constitutional:      General: She is not in acute distress.    Appearance: Normal appearance. She is well-developed.  HENT:     Head: Normocephalic.     Right Ear: Tympanic membrane normal.     Left Ear: Tympanic membrane normal.     Nose: Nose normal.     Mouth/Throat:     Mouth: Mucous membranes are moist.  Eyes:     Pupils: Pupils are equal, round, and reactive to light.  Neck:     Vascular: No carotid bruit or JVD.  Cardiovascular:     Rate and Rhythm: Normal rate and regular rhythm.     Heart sounds: Normal heart sounds.  Pulmonary:     Effort: Pulmonary effort is normal. No respiratory distress.     Breath sounds: Normal breath sounds. No wheezing or rales.  Chest:     Chest wall: No tenderness.  Abdominal:     General: Bowel sounds  are normal. There is no distension or abdominal bruit.     Palpations: Abdomen is soft. There is no hepatomegaly, splenomegaly, mass or pulsatile mass.     Tenderness: There is no abdominal tenderness.  Musculoskeletal:        General: Normal range of motion.     Cervical back: Normal range of motion and neck supple.  Lymphadenopathy:     Cervical: No cervical adenopathy.  Skin:    General: Skin is warm and dry.  Neurological:     Mental Status: She is alert and oriented to person, place, and time.     Deep Tendon Reflexes: Reflexes are normal and symmetric.  Psychiatric:        Behavior: Behavior normal.        Thought Content: Thought content normal.        Judgment: Judgment normal.    BP 138/85   Pulse 82   Temp (!) 97.4 F (36.3 C) (Temporal)   Ht '5\' 1"'$  (1.549 m)   Wt 178 lb 6.4 oz (80.9 kg)   HC 61" (154.9 cm)   SpO2 100%   BMI 33.71 kg/m         Assessment & Plan:   Danielle Wheeler comes in today with chief complaint of No chief complaint on file.   Diagnosis and orders addressed:  1. Annual physical exam - CBC with Differential/Platelet - CMP14+EGFR - Lipid panel - Thyroid Panel With TSH - VITAMIN D 25 Hydroxy (Vit-D Deficiency, Fractures)  2. GAD (generalized anxiety disorder) - Vilazodone HCl (VIIBRYD) 40 MG TABS; Take 0.5 tablets (20 mg total) by mouth daily. (Needs to be seen before next refill)  Dispense: 90 tablet; Refill: 1  3. Panic attacks Stress management - Vilazodone HCl (VIIBRYD) 40 MG TABS; Take 0.5 tablets (20 mg total) by mouth daily. (Needs to be seen before next refill)  Dispense: 90 tablet; Refill: 1   Labs pending Health Maintenance reviewed Diet and exercise encouraged  Follow up plan: 6 months    Mary-Margaret Hassell Done, FNP

## 2023-03-13 DIAGNOSIS — Z1231 Encounter for screening mammogram for malignant neoplasm of breast: Secondary | ICD-10-CM | POA: Diagnosis not present

## 2023-03-13 DIAGNOSIS — Z01419 Encounter for gynecological examination (general) (routine) without abnormal findings: Secondary | ICD-10-CM | POA: Diagnosis not present

## 2023-03-13 DIAGNOSIS — N951 Menopausal and female climacteric states: Secondary | ICD-10-CM | POA: Diagnosis not present

## 2023-03-13 DIAGNOSIS — Z6833 Body mass index (BMI) 33.0-33.9, adult: Secondary | ICD-10-CM | POA: Diagnosis not present

## 2023-07-02 ENCOUNTER — Other Ambulatory Visit: Payer: Self-pay | Admitting: Nurse Practitioner

## 2023-07-02 DIAGNOSIS — F410 Panic disorder [episodic paroxysmal anxiety] without agoraphobia: Secondary | ICD-10-CM

## 2023-07-02 DIAGNOSIS — F411 Generalized anxiety disorder: Secondary | ICD-10-CM

## 2023-07-06 ENCOUNTER — Ambulatory Visit: Payer: BC Managed Care – PPO | Admitting: Nurse Practitioner

## 2023-07-10 DIAGNOSIS — Z9889 Other specified postprocedural states: Secondary | ICD-10-CM | POA: Diagnosis not present

## 2023-07-10 DIAGNOSIS — G8929 Other chronic pain: Secondary | ICD-10-CM | POA: Diagnosis not present

## 2023-07-10 DIAGNOSIS — M25552 Pain in left hip: Secondary | ICD-10-CM | POA: Diagnosis not present

## 2023-07-10 DIAGNOSIS — M25551 Pain in right hip: Secondary | ICD-10-CM | POA: Diagnosis not present

## 2023-07-10 DIAGNOSIS — M5416 Radiculopathy, lumbar region: Secondary | ICD-10-CM | POA: Diagnosis not present

## 2023-07-10 DIAGNOSIS — M5116 Intervertebral disc disorders with radiculopathy, lumbar region: Secondary | ICD-10-CM | POA: Diagnosis not present

## 2023-07-10 DIAGNOSIS — M545 Low back pain, unspecified: Secondary | ICD-10-CM | POA: Diagnosis not present

## 2023-07-31 ENCOUNTER — Ambulatory Visit: Payer: BC Managed Care – PPO | Admitting: Nurse Practitioner

## 2023-07-31 ENCOUNTER — Encounter: Payer: Self-pay | Admitting: Nurse Practitioner

## 2023-07-31 DIAGNOSIS — F411 Generalized anxiety disorder: Secondary | ICD-10-CM | POA: Diagnosis not present

## 2023-07-31 DIAGNOSIS — F410 Panic disorder [episodic paroxysmal anxiety] without agoraphobia: Secondary | ICD-10-CM | POA: Diagnosis not present

## 2023-07-31 MED ORDER — VILAZODONE HCL 40 MG PO TABS: 40.0000 mg | ORAL_TABLET | Freq: Every day | ORAL | 1 refills | Status: DC

## 2023-07-31 NOTE — Progress Notes (Signed)
 Subjective:    Patient ID: Danielle Wheeler, female    DOB: 18-Aug-1975, 48 y.o.   MRN: [id number]  Chief Complaint: Medical Management of Chronic Issues    HPI:  Danielle Wheeler is a 48 y.o. who identifies as a female who was assigned female at birth.   Social history: Lives with: husband  Work history: [employer]   Comes in today for follow up of the following chronic medical issues:  1. GAD (generalized anxiety disorder) 2. Panic attacks Has been on viibryd for several years. She says it keeps her calm and prevents panic attacks. She denies any medication side effects.    07/31/2023    3:57 PM 08/16/2022    3:03 PM 08/17/2021    3:41 PM 08/17/2020    4:16 PM  GAD 7 : Generalized Anxiety Score  Nervous, Anxious, on Edge 0 0 0 0  Control/stop worrying 0 1 0 0  Worry too much - different things 0 0 0 0  Trouble relaxing 0 0 0 0  Restless 0 1 0 0  Easily annoyed or irritable 0 0 0 0  Afraid - awful might happen 0 0 0 0  Total GAD 7 Score 0 2 0 0  Anxiety Difficulty Not difficult at all Not difficult at all Not difficult at all Not difficult at all       07/31/2023    3:57 PM 08/16/2022    3:02 PM 08/17/2021    3:41 PM  Depression screen PHQ 2/9  Decreased Interest 0 2 0  Down, Depressed, Hopeless 0 0 0  PHQ - 2 Score 0 2 0  Altered sleeping 0 1 0  Tired, decreased energy 0 1 1  Change in appetite 0 2 0  Feeling bad or failure about yourself  0 0 0  Trouble concentrating 0 0 0  Moving slowly or fidgety/restless 0 0 0  Suicidal thoughts 0 0 0  PHQ-9 Score 0 6 1  Difficult doing work/chores Not difficult at all Not difficult at all Not difficult at all     New complaints: None today  No Known Allergies Outpatient Encounter Medications as of 07/31/2023  Medication Sig   Vilazodone HCl (VIIBRYD) 40 MG TABS Take 0.5 tablets (20 mg total) by mouth daily.   cetirizine (ZYRTEC) 10 MG tablet Take 1 tablet (10 mg total) by mouth daily. (Patient not taking: Reported on  07/31/2023)   fluticasone (FLONASE) 50 MCG/ACT nasal spray Place 2 sprays into the nose daily. (Patient not taking: Reported on 07/31/2023)   No facility-administered encounter medications on file as of 07/31/2023.    Past Surgical History:  Procedure Laterality Date   BACK SURGERY  04/29/2020   TONSILLECTOMY      Family History  Problem Relation Age of Onset   Hypertension Mother    Diabetes Mother    Atrial fibrillation Mother    Diabetes Father    Heart disease Father       Controlled substance contract: n/a     Review of Systems  Constitutional:  Negative for diaphoresis.  Eyes:  Negative for pain.  Respiratory:  Negative for shortness of breath.   Cardiovascular:  Negative for chest pain, palpitations and leg swelling.  Gastrointestinal:  Negative for abdominal pain.  Endocrine: Negative for polydipsia.  Skin:  Negative for rash.  Neurological:  Negative for dizziness, weakness and headaches.  Hematological:  Does not bruise/bleed easily.  All other systems reviewed and are negative.  Objective:   Physical Exam Vitals and nursing note reviewed.  Constitutional:      General: She is not in acute distress.    Appearance: Normal appearance. She is well-developed.  HENT:     Head: Normocephalic.     Right Ear: Tympanic membrane normal.     Left Ear: Tympanic membrane normal.     Nose: Nose normal.     Mouth/Throat:     Mouth: Mucous membranes are moist.  Eyes:     Pupils: Pupils are equal, round, and reactive to light.  Neck:     Vascular: No carotid bruit or JVD.  Cardiovascular:     Rate and Rhythm: Normal rate and regular rhythm.     Heart sounds: Normal heart sounds.  Pulmonary:     Effort: Pulmonary effort is normal. No respiratory distress.     Breath sounds: Normal breath sounds. No wheezing or rales.  Chest:     Chest wall: No tenderness.  Abdominal:     General: Bowel sounds are normal. There is no distension or abdominal bruit.      Palpations: Abdomen is soft. There is no hepatomegaly, splenomegaly, mass or pulsatile mass.     Tenderness: There is no abdominal tenderness.  Musculoskeletal:        General: Normal range of motion.     Cervical back: Normal range of motion and neck supple.  Lymphadenopathy:     Cervical: No cervical adenopathy.  Skin:    General: Skin is warm and dry.  Neurological:     Mental Status: She is alert and oriented to person, place, and time.     Deep Tendon Reflexes: Reflexes are normal and symmetric.  Psychiatric:        Behavior: Behavior normal.        Thought Content: Thought content normal.        Judgment: Judgment normal.     BP (!) 136/94   Pulse 68   Temp 98 F (36.7 C) (Temporal)   Ht 5\' 1"  (1.549 m)   Wt 183 lb (83 kg)   SpO2 98%   BMI 34.58 kg/m        Assessment & Plan:   Danielle Wheeler comes in today with chief complaint of Medical Management of Chronic Issues   Diagnosis and orders addressed:  1. GAD (generalized anxiety disorder) - Vilazodone HCl (VIIBRYD) 40 MG TABS; Take 1 tablets (40 mg total) by mouth daily. (Needs to be seen before next refill)  Dispense: 90 tablet; Refill: 1  2. Panic attacks - Vilazodone HCl (VIIBRYD) 40 MG TABS; Take 1 tablets (40 mg total) by mouth daily. (Needs to be seen before next refill)  Dispense: 90 tablet; Refill: 1 Stress manaegment  Follow up plan: 6 months   Mary-Margaret Daphine Deutscher, FNP

## 2023-07-31 NOTE — Patient Instructions (Signed)

## 2023-08-03 DIAGNOSIS — M160 Bilateral primary osteoarthritis of hip: Secondary | ICD-10-CM | POA: Diagnosis not present

## 2023-08-03 DIAGNOSIS — M7062 Trochanteric bursitis, left hip: Secondary | ICD-10-CM | POA: Diagnosis not present

## 2023-08-03 DIAGNOSIS — M461 Sacroiliitis, not elsewhere classified: Secondary | ICD-10-CM | POA: Diagnosis not present

## 2023-08-03 DIAGNOSIS — M7632 Iliotibial band syndrome, left leg: Secondary | ICD-10-CM | POA: Diagnosis not present

## 2023-08-03 DIAGNOSIS — M7631 Iliotibial band syndrome, right leg: Secondary | ICD-10-CM | POA: Diagnosis not present

## 2023-08-03 DIAGNOSIS — M25552 Pain in left hip: Secondary | ICD-10-CM | POA: Diagnosis not present

## 2023-08-03 DIAGNOSIS — M25551 Pain in right hip: Secondary | ICD-10-CM | POA: Diagnosis not present

## 2023-08-24 DIAGNOSIS — M7062 Trochanteric bursitis, left hip: Secondary | ICD-10-CM | POA: Diagnosis not present

## 2023-09-30 ENCOUNTER — Other Ambulatory Visit: Payer: Self-pay | Admitting: Nurse Practitioner

## 2023-09-30 DIAGNOSIS — F410 Panic disorder [episodic paroxysmal anxiety] without agoraphobia: Secondary | ICD-10-CM

## 2023-09-30 DIAGNOSIS — F411 Generalized anxiety disorder: Secondary | ICD-10-CM

## 2023-10-05 DIAGNOSIS — M7062 Trochanteric bursitis, left hip: Secondary | ICD-10-CM | POA: Diagnosis not present

## 2024-03-26 ENCOUNTER — Other Ambulatory Visit: Payer: Self-pay | Admitting: Nurse Practitioner

## 2024-03-26 DIAGNOSIS — F410 Panic disorder [episodic paroxysmal anxiety] without agoraphobia: Secondary | ICD-10-CM

## 2024-03-26 DIAGNOSIS — F411 Generalized anxiety disorder: Secondary | ICD-10-CM

## 2024-03-26 NOTE — Telephone Encounter (Signed)
 MMM pt NTBS 30-d given 03/26/24

## 2024-03-27 ENCOUNTER — Encounter: Payer: Self-pay | Admitting: Nurse Practitioner

## 2024-03-27 NOTE — Telephone Encounter (Signed)
 Letter mailed

## 2024-04-03 ENCOUNTER — Telehealth: Payer: Self-pay

## 2024-04-03 NOTE — Telephone Encounter (Signed)
 Copied from CRM [id number]. Topic: Clinical - Prescription Issue >> Apr 03, 2024 12:18 PM Jasmin G wrote: Reason for CRM: Pt states that when she picked up her prescription for Vilazodone  HCl (VIIBRYD ) 40 MG TABS it was only refilled for 30 days when she usually gets a 90 day supply because it's cheaper, pt states that this time she paid for the 30 day because she's going out of town tomorrow and needs her prescription but she will need a 90 day supply since her next appt is on January.    04/03/2024 @ 12:43 PM Left detailed message for patient making her aware that the reason the Rx was only sent for 30 days is because patient is past due for an appt. PCP sent in an additional refill for 30 days but noted on the Rx that patient must be seen before more refills can be sent in.   Patient needs to make an appt. Was last seen in Feb 2025 and per those OV notes, patient was supposed to come back in 6 months (around Aug 2025) for a 6 mth follow up but hasn't come back in since Feb.

## 2024-04-22 DIAGNOSIS — M7062 Trochanteric bursitis, left hip: Secondary | ICD-10-CM | POA: Diagnosis not present

## 2024-04-22 DIAGNOSIS — M7061 Trochanteric bursitis, right hip: Secondary | ICD-10-CM | POA: Diagnosis not present

## 2024-04-22 DIAGNOSIS — M25552 Pain in left hip: Secondary | ICD-10-CM | POA: Diagnosis not present

## 2024-04-22 DIAGNOSIS — R29898 Other symptoms and signs involving the musculoskeletal system: Secondary | ICD-10-CM | POA: Diagnosis not present

## 2024-04-23 ENCOUNTER — Ambulatory Visit: Payer: Self-pay | Admitting: Nurse Practitioner

## 2024-04-23 ENCOUNTER — Encounter: Payer: Self-pay | Admitting: Nurse Practitioner

## 2024-04-23 VITALS — BP 148/92 | HR 82 | Temp 98.2°F | Ht 61.0 in | Wt 184.0 lb

## 2024-04-23 DIAGNOSIS — Z0001 Encounter for general adult medical examination with abnormal findings: Secondary | ICD-10-CM | POA: Diagnosis not present

## 2024-04-23 DIAGNOSIS — I10 Essential (primary) hypertension: Secondary | ICD-10-CM

## 2024-04-23 DIAGNOSIS — Z0000 Encounter for general adult medical examination without abnormal findings: Secondary | ICD-10-CM | POA: Diagnosis not present

## 2024-04-23 DIAGNOSIS — F410 Panic disorder [episodic paroxysmal anxiety] without agoraphobia: Secondary | ICD-10-CM | POA: Diagnosis not present

## 2024-04-23 DIAGNOSIS — F411 Generalized anxiety disorder: Secondary | ICD-10-CM | POA: Diagnosis not present

## 2024-04-23 MED ORDER — LISINOPRIL 20 MG PO TABS: 20.0000 mg | ORAL_TABLET | Freq: Every day | ORAL | 3 refills | Status: AC

## 2024-04-23 MED ORDER — VILAZODONE HCL 40 MG PO TABS: 40.0000 mg | ORAL_TABLET | Freq: Every day | ORAL | 1 refills | Status: DC

## 2024-04-23 NOTE — Patient Instructions (Signed)

## 2024-04-23 NOTE — Progress Notes (Signed)
 Subjective:    Patient ID: Danielle Wheeler, female    DOB: 12/03/1975, 48 y.o.   MRN: [id number]   Chief Complaint: annual physical   HPI:  Danielle Wheeler is a 48 y.o. who identifies as a female who was assigned female at birth.   Social history: Lives with: husband Work history: [employer]   Comes in today for follow up of the following chronic medical issues:  1. Annual physical exam LMP- IUD  2. GAD (generalized anxiety disorder) 3. Panic attacks Patient is on viibryd  daily and is doing fairly well. She is currently on 20mg  daily and thinks she needs to up it to 40mg  daily.    04/23/2024    4:08 PM 07/31/2023    3:57 PM 08/16/2022    3:03 PM 08/17/2021    3:41 PM  GAD 7 : Generalized Anxiety Score  Nervous, Anxious, on Edge 0 0 0 0  Control/stop worrying 0 0 1 0  Worry too much - different things 0 0 0 0  Trouble relaxing 0 0 0 0  Restless 0 0 1 0  Easily annoyed or irritable 0 0 0 0  Afraid - awful might happen 0 0 0 0  Total GAD 7 Score 0 0 2 0  Anxiety Difficulty Not difficult at all Not difficult at all Not difficult at all Not difficult at all       04/23/2024    4:08 PM 07/31/2023    3:57 PM 08/16/2022    3:02 PM  Depression screen PHQ 2/9  Decreased Interest 0 0 2  Down, Depressed, Hopeless 0 0 0  PHQ - 2 Score 0 0 2  Altered sleeping  0 1  Tired, decreased energy  0 1  Change in appetite  0 2  Feeling bad or failure about yourself   0 0  Trouble concentrating  0 0  Moving slowly or fidgety/restless  0 0  Suicidal thoughts  0 0  PHQ-9 Score  0  6   Difficult doing work/chores  Not difficult at all Not difficult at all     Data saved with a previous flowsheet row definition      New complaints: None today  No Known Allergies Outpatient Encounter Medications as of 04/23/2024  Medication Sig   Vilazodone  HCl (VIIBRYD ) 40 MG TABS Take 1 tablet (40 mg total) by mouth daily. **NEEDS TO BE SEEN BEFORE NEXT REFILL**   cetirizine   (ZYRTEC ) 10 MG tablet Take 1 tablet (10 mg total) by mouth daily. (Patient not taking: Reported on 04/23/2024)   fluticasone  (FLONASE ) 50 MCG/ACT nasal spray Place 2 sprays into the nose daily. (Patient not taking: Reported on 04/23/2024)   No facility-administered encounter medications on file as of 04/23/2024.    Past Surgical History:  Procedure Laterality Date   BACK SURGERY  04/29/2020   TONSILLECTOMY      Family History  Problem Relation Age of Onset   Hypertension Mother    Diabetes Mother    Atrial fibrillation Mother    Diabetes Father    Heart disease Father       Controlled substance contract: n/a     Review of Systems  Constitutional:  Negative for diaphoresis.  Eyes:  Negative for pain.  Respiratory:  Negative for shortness of breath.   Cardiovascular:  Negative for chest pain, palpitations and leg swelling.  Gastrointestinal:  Negative for abdominal pain.  Endocrine: Negative for polydipsia.  Skin:  Negative for rash.  Neurological:  Negative for dizziness, weakness and headaches.  Hematological:  Does not bruise/bleed easily.  All other systems reviewed and are negative.      Objective:   Physical Exam Vitals and nursing note reviewed.  Constitutional:      General: She is not in acute distress.    Appearance: Normal appearance. She is well-developed.  HENT:     Head: Normocephalic.     Right Ear: Tympanic membrane normal.     Left Ear: Tympanic membrane normal.     Nose: Nose normal.     Mouth/Throat:     Mouth: Mucous membranes are moist.  Eyes:     Pupils: Pupils are equal, round, and reactive to light.  Neck:     Vascular: No carotid bruit or JVD.  Cardiovascular:     Rate and Rhythm: Normal rate and regular rhythm.     Heart sounds: Normal heart sounds.  Pulmonary:     Effort: Pulmonary effort is normal. No respiratory distress.     Breath sounds: Normal breath sounds. No wheezing or rales.  Chest:     Chest wall: No tenderness.   Abdominal:     General: Bowel sounds are normal. There is no distension or abdominal bruit.     Palpations: Abdomen is soft. There is no hepatomegaly, splenomegaly, mass or pulsatile mass.     Tenderness: There is no abdominal tenderness.  Musculoskeletal:        General: Normal range of motion.     Cervical back: Normal range of motion and neck supple.  Lymphadenopathy:     Cervical: No cervical adenopathy.  Skin:    General: Skin is warm and dry.  Neurological:     Mental Status: She is alert and oriented to person, place, and time.     Deep Tendon Reflexes: Reflexes are normal and symmetric.  Psychiatric:        Behavior: Behavior normal.        Thought Content: Thought content normal.        Judgment: Judgment normal.    BP (!) 148/92   Pulse 82   Temp 98.2 F (36.8 C) (Temporal)   Ht 5' 1 (1.549 m)   Wt 184 lb (83.5 kg)   SpO2 97%   BMI 34.77 kg/m          Assessment & Plan:   Danielle Wheeler comes in today with chief complaint of Medical Management of Chronic Issues   Diagnosis and orders addressed:  1. Annual physical exam - CBC with Differential/Platelet - CMP14+EGFR - Lipid panel - Thyroid  Panel With TSH - VITAMIN D 25 Hydroxy (Vit-D Deficiency, Fractures)  2. GAD (generalized anxiety disorder) - Vilazodone  HCl (VIIBRYD ) 40 MG TABS; Take 0.5 tablets (20 mg total) by mouth daily. (Needs to be seen before next refill)  Dispense: 90 tablet; Refill: 1  3. Panic attacks Stress management - Vilazodone  HCl (VIIBRYD ) 40 MG TABS; Take 0.5 tablets (20 mg total) by mouth daily. (Needs to be seen before next refill)  Dispense: 90 tablet; Refill: 1  4. Hypertension Dash diet Added lisinopril 20mg  1 po daily Let me now if develop a cough  Labs pending Health Maintenance reviewed Diet and exercise encouraged  Follow up plan: 6 months    Mary-Margaret Gladis, FNP

## 2024-04-24 LAB — LIPID PANEL
Chol/HDL Ratio: 3.2 ratio (ref 0.0–4.4)
Cholesterol, Total: 197 mg/dL (ref 100–199)
HDL: 61 mg/dL (ref >39)
LDL Chol Calc (NIH): 107 mg/dL — ABNORMAL HIGH (ref 0–99)
Triglycerides: 166 mg/dL — ABNORMAL HIGH (ref 0–149)
VLDL Cholesterol Cal: 29 mg/dL (ref 5–40)

## 2024-04-24 LAB — THYROID PANEL WITH TSH
Free Thyroxine Index: 2.1 (ref 1.2–4.9)
T3 Uptake Ratio: 18 % — ABNORMAL LOW (ref 24–39)
T4, Total: 11.4 ug/dL (ref 4.5–12.0)
TSH: 1.77 u[IU]/mL (ref 0.450–4.500)

## 2024-04-24 LAB — CBC WITH DIFFERENTIAL/PLATELET
Basophils Absolute: 0 x10E3/uL (ref 0.0–0.2)
Basos: 0 %
EOS (ABSOLUTE): 0.1 x10E3/uL (ref 0.0–0.4)
Eos: 1 %
Hematocrit: 41 % (ref 34.0–46.6)
Hemoglobin: 14 g/dL (ref 11.1–15.9)
Immature Grans (Abs): 0 x10E3/uL (ref 0.0–0.1)
Immature Granulocytes: 0 %
Lymphocytes Absolute: 2.2 x10E3/uL (ref 0.7–3.1)
Lymphs: 24 %
MCH: 33.7 pg — ABNORMAL HIGH (ref 26.6–33.0)
MCHC: 34.1 g/dL (ref 31.5–35.7)
MCV: 99 fL — ABNORMAL HIGH (ref 79–97)
Monocytes Absolute: 0.7 x10E3/uL (ref 0.1–0.9)
Monocytes: 8 %
Neutrophils Absolute: 6.3 x10E3/uL (ref 1.4–7.0)
Neutrophils: 67 %
Platelets: 349 x10E3/uL (ref 150–450)
RBC: 4.16 x10E6/uL (ref 3.77–5.28)
RDW: 11.4 % — ABNORMAL LOW (ref 11.7–15.4)
WBC: 9.3 x10E3/uL (ref 3.4–10.8)

## 2024-04-24 LAB — CMP14+EGFR
ALT: 12 IU/L (ref 0–32)
AST: 19 IU/L (ref 0–40)
Albumin: 4.2 g/dL (ref 3.9–4.9)
Alkaline Phosphatase: 75 IU/L (ref 41–116)
BUN/Creatinine Ratio: 10 (ref 9–23)
BUN: 8 mg/dL (ref 6–24)
Bilirubin Total: 0.2 mg/dL (ref 0.0–1.2)
CO2: 24 mmol/L (ref 20–29)
Calcium: 9 mg/dL (ref 8.7–10.2)
Chloride: 101 mmol/L (ref 96–106)
Creatinine, Ser: 0.81 mg/dL (ref 0.57–1.00)
Globulin, Total: 3 g/dL (ref 1.5–4.5)
Glucose: 97 mg/dL (ref 70–99)
Potassium: 4.6 mmol/L (ref 3.5–5.2)
Sodium: 139 mmol/L (ref 134–144)
Total Protein: 7.2 g/dL (ref 6.0–8.5)
eGFR: 88 mL/min/1.73 (ref >59)

## 2024-04-24 LAB — VITAMIN D 25 HYDROXY (VIT D DEFICIENCY, FRACTURES): Vit D, 25-Hydroxy: 43.3 ng/mL (ref 30.0–100.0)

## 2024-04-25 ENCOUNTER — Ambulatory Visit: Payer: Self-pay | Admitting: Nurse Practitioner

## 2024-04-30 ENCOUNTER — Telehealth: Payer: Self-pay | Admitting: Nurse Practitioner

## 2024-04-30 DIAGNOSIS — F410 Panic disorder [episodic paroxysmal anxiety] without agoraphobia: Secondary | ICD-10-CM

## 2024-04-30 DIAGNOSIS — F411 Generalized anxiety disorder: Secondary | ICD-10-CM

## 2024-04-30 MED ORDER — VILAZODONE HCL 40 MG PO TABS: 40.0000 mg | ORAL_TABLET | Freq: Every day | ORAL | 1 refills | Status: AC

## 2024-04-30 NOTE — Telephone Encounter (Signed)
 Copied from CRM #[id number]. Topic: Clinical - Prescription Issue >> Apr 30, 2024  9:51 AM Danielle Wheeler wrote: Reason for CRM: Patient states PCP should've sent in a 90 day supply of Vilazodone  HCl (VIIBRYD ) 40 MG TABS, pharmacy received a 30 day. It is cheaper for the patient to pick up a 90 day supply. Please re send   [id number] - [place], [place] - [address]

## 2024-10-18 ENCOUNTER — Ambulatory Visit: Admitting: Nurse Practitioner
